# Patient Record
Sex: Male | Born: 1960
Health system: Southern US, Community
[De-identification: ages and names within clinical notes are randomized; demographics above are authoritative.]

## PROBLEM LIST (undated history)

## (undated) DIAGNOSIS — F329 Major depressive disorder, single episode, unspecified: Secondary | ICD-10-CM

## (undated) DIAGNOSIS — M549 Dorsalgia, unspecified: Secondary | ICD-10-CM

## (undated) DIAGNOSIS — Z8719 Personal history of other diseases of the digestive system: Secondary | ICD-10-CM

## (undated) DIAGNOSIS — M25569 Pain in unspecified knee: Secondary | ICD-10-CM

## (undated) DIAGNOSIS — N289 Disorder of kidney and ureter, unspecified: Secondary | ICD-10-CM

## (undated) DIAGNOSIS — Z8711 Personal history of peptic ulcer disease: Secondary | ICD-10-CM

## (undated) DIAGNOSIS — F32A Depression, unspecified: Secondary | ICD-10-CM

## (undated) DIAGNOSIS — G8929 Other chronic pain: Secondary | ICD-10-CM

## (undated) DIAGNOSIS — I509 Heart failure, unspecified: Secondary | ICD-10-CM

## (undated) DIAGNOSIS — M542 Cervicalgia: Secondary | ICD-10-CM

## (undated) DIAGNOSIS — M5416 Radiculopathy, lumbar region: Secondary | ICD-10-CM

## (undated) HISTORY — PX: KNEE SURGERY: SHX244

## (undated) HISTORY — PX: APPENDECTOMY: SHX54

## (undated) HISTORY — PX: JOINT REPLACEMENT: SHX530

---

## 2001-05-29 ENCOUNTER — Ambulatory Visit (HOSPITAL_COMMUNITY): Admission: RE | Admit: 2001-05-29 | Discharge: 2001-05-29 | Payer: Self-pay | Admitting: Family Medicine

## 2001-05-29 ENCOUNTER — Encounter: Payer: Self-pay | Admitting: Family Medicine

## 2001-11-30 ENCOUNTER — Emergency Department (HOSPITAL_COMMUNITY): Admission: EM | Admit: 2001-11-30 | Discharge: 2001-11-30 | Payer: Self-pay | Admitting: Emergency Medicine

## 2001-12-29 ENCOUNTER — Ambulatory Visit (HOSPITAL_COMMUNITY): Admission: RE | Admit: 2001-12-29 | Discharge: 2001-12-29 | Payer: Self-pay | Admitting: Neurosurgery

## 2002-01-06 ENCOUNTER — Ambulatory Visit (HOSPITAL_COMMUNITY): Admission: RE | Admit: 2002-01-06 | Discharge: 2002-01-06 | Payer: Self-pay | Admitting: Neurosurgery

## 2002-01-06 ENCOUNTER — Encounter: Payer: Self-pay | Admitting: Neurosurgery

## 2002-08-19 ENCOUNTER — Emergency Department (HOSPITAL_COMMUNITY): Admission: EM | Admit: 2002-08-19 | Discharge: 2002-08-19 | Payer: Self-pay | Admitting: Internal Medicine

## 2003-07-01 ENCOUNTER — Emergency Department (HOSPITAL_COMMUNITY): Admission: EM | Admit: 2003-07-01 | Discharge: 2003-07-01 | Payer: Self-pay | Admitting: Emergency Medicine

## 2003-07-22 ENCOUNTER — Emergency Department (HOSPITAL_COMMUNITY): Admission: EM | Admit: 2003-07-22 | Discharge: 2003-07-22 | Payer: Self-pay | Admitting: Emergency Medicine

## 2004-05-04 ENCOUNTER — Emergency Department (HOSPITAL_COMMUNITY): Admission: EM | Admit: 2004-05-04 | Discharge: 2004-05-04 | Payer: Self-pay | Admitting: Emergency Medicine

## 2004-05-07 ENCOUNTER — Emergency Department (HOSPITAL_COMMUNITY): Admission: EM | Admit: 2004-05-07 | Discharge: 2004-05-07 | Payer: Self-pay | Admitting: Emergency Medicine

## 2004-05-14 ENCOUNTER — Ambulatory Visit (HOSPITAL_COMMUNITY): Admission: RE | Admit: 2004-05-14 | Discharge: 2004-05-14 | Payer: Self-pay | Admitting: Orthopedic Surgery

## 2004-10-17 ENCOUNTER — Emergency Department (HOSPITAL_COMMUNITY): Admission: EM | Admit: 2004-10-17 | Discharge: 2004-10-18 | Payer: Self-pay | Admitting: *Deleted

## 2005-02-15 ENCOUNTER — Emergency Department (HOSPITAL_COMMUNITY): Admission: EM | Admit: 2005-02-15 | Discharge: 2005-02-15 | Payer: Self-pay | Admitting: Emergency Medicine

## 2008-08-05 ENCOUNTER — Ambulatory Visit (HOSPITAL_COMMUNITY): Admission: RE | Admit: 2008-08-05 | Discharge: 2008-08-05 | Payer: Self-pay | Admitting: Family Medicine

## 2008-08-08 ENCOUNTER — Ambulatory Visit (HOSPITAL_COMMUNITY): Admission: RE | Admit: 2008-08-08 | Discharge: 2008-08-08 | Payer: Self-pay | Admitting: Family Medicine

## 2009-04-12 ENCOUNTER — Ambulatory Visit (HOSPITAL_COMMUNITY): Admission: RE | Admit: 2009-04-12 | Discharge: 2009-04-12 | Payer: Self-pay | Admitting: Family Medicine

## 2010-11-18 ENCOUNTER — Encounter: Payer: Self-pay | Admitting: *Deleted

## 2012-07-02 ENCOUNTER — Encounter (HOSPITAL_COMMUNITY): Payer: Self-pay | Admitting: *Deleted

## 2012-07-02 ENCOUNTER — Emergency Department (HOSPITAL_COMMUNITY)
Admission: EM | Admit: 2012-07-02 | Discharge: 2012-07-02 | Disposition: A | Discharge status: Home / Self Care | Payer: Self-pay | Attending: Emergency Medicine | Admitting: Emergency Medicine

## 2012-07-02 DIAGNOSIS — M549 Dorsalgia, unspecified: Secondary | ICD-10-CM | POA: Insufficient documentation

## 2012-07-02 DIAGNOSIS — F329 Major depressive disorder, single episode, unspecified: Secondary | ICD-10-CM | POA: Insufficient documentation

## 2012-07-02 DIAGNOSIS — G8929 Other chronic pain: Secondary | ICD-10-CM | POA: Insufficient documentation

## 2012-07-02 DIAGNOSIS — F3289 Other specified depressive episodes: Secondary | ICD-10-CM | POA: Insufficient documentation

## 2012-07-02 HISTORY — DX: Personal history of peptic ulcer disease: Z87.11

## 2012-07-02 HISTORY — DX: Major depressive disorder, single episode, unspecified: F32.9

## 2012-07-02 HISTORY — DX: Depression, unspecified: F32.A

## 2012-07-02 HISTORY — DX: Personal history of other diseases of the digestive system: Z87.19

## 2012-07-02 MED ORDER — OXYCODONE-ACETAMINOPHEN 5-325 MG PO TABS: 1.0000 | ORAL_TABLET | ORAL | Status: AC | PRN

## 2012-07-02 MED ORDER — OXYCODONE-ACETAMINOPHEN 5-325 MG PO TABS
1.0000 | ORAL_TABLET | Freq: Once | ORAL | Status: AC
  Administered 2012-07-02: 1 via ORAL
  Filled 2012-07-02: qty 1

## 2012-07-02 NOTE — ED Provider Notes (Signed)
History     CSN: [id number]  Arrival date & time 07/02/12  1415   First MD Initiated Contact with Patient 07/02/12 1445      Chief Complaint  Patient presents with  . Back Pain    (Consider location/radiation/quality/duration/timing/severity/associated sxs/prior treatment) HPI Comments: PARVIN STETZER  presents with acute on chronic low back pain which has which has been present for the past 2 days.   Patient denies any new injury specifically.  There is  radiation of pain into his right lower extremity which is constant for him and not worsened.  There has been no weakness or numbness in the lower extremities and no urinary or bowel retention or incontinence.  Patient does not have a history of cancer or IVDU.  He has seen Dr. Jeral Fruit in the past and has known degenerative disk disease in his lower back per MRI but it was determined he would not benefit from surgery.  He has been on narcotic prescriptions for pain control,  Ran out approximately 1 month ago.    The history is provided by the patient.    Past Medical History  Diagnosis Date  . Back pain   . History of stomach ulcers   . Depression     Past Surgical History  Procedure Date  . Appendectomy     No family history on file.  History  Substance Use Topics  . Smoking status: Never Smoker   . Smokeless tobacco: Not on file  . Alcohol Use: No      Review of Systems  Constitutional: Negative for fever.  Respiratory: Negative for shortness of breath.   Cardiovascular: Negative for chest pain and leg swelling.  Gastrointestinal: Negative for abdominal pain, constipation and abdominal distention.  Genitourinary: Negative for dysuria, urgency, frequency, flank pain and difficulty urinating.  Musculoskeletal: Positive for back pain. Negative for joint swelling and gait problem.  Skin: Negative for rash.  Neurological: Negative for weakness and numbness.    Allergies  Review of patient's allergies indicates no  known allergies.  Home Medications   Current Outpatient Rx  Name Route Sig Dispense Refill  . BC HEADACHE POWDER PO Oral Take 1 packet by mouth 2 (two) times daily as needed. Arthritis in back and knees.    Marland Kitchen VITAMIN B-12 CR PO Oral Take 1 tablet by mouth daily.    Marland Kitchen DOXEPIN HCL 100 MG PO CAPS Oral Take 100 mg by mouth at bedtime.    . OXYCODONE-ACETAMINOPHEN 5-325 MG PO TABS Oral Take 1 tablet by mouth every 4 (four) hours as needed for pain. 20 tablet 0    BP 137/81  Pulse 73  Temp 97.9 F (36.6 C) (Oral)  Resp 20  Ht 5\' 11"  (1.803 m)  Wt 155 lb (70.308 kg)  BMI 21.62 kg/m2  SpO2 98%  Physical Exam  Nursing note and vitals reviewed. Constitutional: He appears well-developed and well-nourished.  HENT:  Head: Normocephalic.  Eyes: Conjunctivae are normal.  Neck: Normal range of motion. Neck supple.  Cardiovascular: Normal rate and intact distal pulses.        Pedal pulses normal.  Pulmonary/Chest: Effort normal.  Abdominal: Soft. Bowel sounds are normal. He exhibits no distension and no mass.  Musculoskeletal: Normal range of motion. He exhibits no edema.       Lumbar back: He exhibits tenderness. He exhibits no swelling, no edema and no spasm.  Neurological: He is alert. He has normal strength. He displays no atrophy and no tremor. No  sensory deficit. Gait normal.  Reflex Scores:      Patellar reflexes are 2+ on the right side and 2+ on the left side.      Achilles reflexes are 2+ on the right side and 2+ on the left side.      No strength deficit noted in hip and knee flexor and extensor muscle groups.  Ankle flexion and extension intact.  Skin: Skin is warm and dry.  Psychiatric: He has a normal mood and affect.    ED Course  Procedures (including critical care time)  Labs Reviewed - No data to display No results found.   1. Chronic back pain       MDM  Oxycodone prescribed.  Pt given referrals for pcp and for chronic pain management.  No neuro deficit on  exam or by history to suggest emergent or surgical presentation.  Also discussed worsened sx that should prompt immediate re-evaluation including distal weakness, bowel/bladder retention/incontinence.              Burgess Amor, [place] 07/02/12 [phone]

## 2012-07-02 NOTE — ED Notes (Signed)
States he has a long history of back pain and does not have any meds for pain at this time.

## 2012-07-02 NOTE — ED Provider Notes (Signed)
Medical screening examination/treatment/procedure(s) were performed by non-physician practitioner and as supervising physician I was immediately available for consultation/collaboration.   Benny Lennert, MD 07/02/12 [phone]

## 2012-07-02 NOTE — Discharge Instructions (Signed)
Chronic Back Pain When back pain lasts longer than 3 months, it is called chronic back pain.This pain can be frustrating, but the cause of the pain is rarely dangerous.People with chronic back pain often go through certain periods that are more intense (flare-ups). CAUSES Chronic back pain can be caused by wear and tear (degeneration) on different structures in your back. These structures may include bones, ligaments, or discs. This degeneration may result in more pressure being placed on the nerves that travel to your legs and feet. This can lead to pain traveling from the low back down the back of the legs. When pain lasts longer than 3 months, it is not unusual for people to experience anxiety or depression. Anxiety and depression can also contribute to low back pain. TREATMENT  Establish a regular exercise plan. This is critical to improving your functional level.   Have a self-management plan for when you flare-up. Flare-ups rarely require a medical visit. Regular exercise will help reduce the intensity and frequency of your flare-ups.   Manage how you feel about your back pain and the rest of your life. Anxiety, depression, and feeling that you cannot alter your back pain have been shown to make back pain more intense and debilitating.   Medicines should never be your only treatment. They should be used along with other treatments to help you return to a more active lifestyle.   Procedures such as injections or surgery may be helpful but are rarely necessary. You may be able to get the same results with physical therapy or chiropractic care.  HOME CARE INSTRUCTIONS  Avoid bending, heavy lifting, prolonged sitting, and activities which make the problem worse.   Continue normal activity as much as possible.   Take brief periods of rest throughout the day to reduce your pain during flare-ups.   Follow your back exercise rehabilitation program. This can help reduce symptoms and prevent  more pain.   Only take over-the-counter or prescription medicines as directed by your caregiver. Muscle relaxants are sometimes prescribed. Narcotic pain medicine is discouraged for long-term pain, since addiction is a possible outcome.   If you smoke, quit.   Eat healthy foods and maintain a recommended body weight.  SEEK IMMEDIATE MEDICAL CARE IF:   You have weakness or numbness in one of your legs or feet.   You have trouble controlling your bladder or bowels.   You develop nausea, vomiting, abdominal pain, shortness of breath, or fainting.  Document Released: 11/21/2004 Document Revised: 10/03/2011 Document Reviewed: 09/28/2011 [employer] Patient Information 2012 [employer]. 

## 2012-07-02 NOTE — ED Notes (Signed)
Lower back pain x 2 days  Denies new injury

## 2012-07-26 ENCOUNTER — Encounter (HOSPITAL_COMMUNITY): Payer: Self-pay | Admitting: Emergency Medicine

## 2012-07-26 ENCOUNTER — Emergency Department (HOSPITAL_COMMUNITY)
Admission: EM | Admit: 2012-07-26 | Discharge: 2012-07-26 | Disposition: A | Discharge status: Home / Self Care | Payer: Self-pay | Attending: Emergency Medicine | Admitting: Emergency Medicine

## 2012-07-26 DIAGNOSIS — Z9089 Acquired absence of other organs: Secondary | ICD-10-CM | POA: Insufficient documentation

## 2012-07-26 DIAGNOSIS — F3289 Other specified depressive episodes: Secondary | ICD-10-CM | POA: Insufficient documentation

## 2012-07-26 DIAGNOSIS — M25569 Pain in unspecified knee: Secondary | ICD-10-CM | POA: Insufficient documentation

## 2012-07-26 DIAGNOSIS — F329 Major depressive disorder, single episode, unspecified: Secondary | ICD-10-CM | POA: Insufficient documentation

## 2012-07-26 DIAGNOSIS — M545 Low back pain, unspecified: Secondary | ICD-10-CM | POA: Insufficient documentation

## 2012-07-26 DIAGNOSIS — G8929 Other chronic pain: Secondary | ICD-10-CM | POA: Insufficient documentation

## 2012-07-26 DIAGNOSIS — Z8711 Personal history of peptic ulcer disease: Secondary | ICD-10-CM | POA: Insufficient documentation

## 2012-07-26 MED ORDER — OXYCODONE-ACETAMINOPHEN 5-325 MG PO TABS
1.0000 | ORAL_TABLET | Freq: Once | ORAL | Status: AC
  Administered 2012-07-26: 1 via ORAL
  Filled 2012-07-26: qty 1

## 2012-07-26 MED ORDER — OXYCODONE-ACETAMINOPHEN 5-325 MG PO TABS: 1.0000 | ORAL_TABLET | ORAL | Status: AC | PRN

## 2012-07-26 NOTE — ED Notes (Signed)
Patient with no complaints at this time. Respirations even and unlabored. Skin warm/dry. Discharge instructions reviewed with patient at this time. Patient given opportunity to voice concerns/ask questions. Patient discharged at this time and left Emergency Department with steady gait.   

## 2012-07-26 NOTE — ED Provider Notes (Signed)
History     CSN: [id number]  Arrival date & time 07/26/12  1401   First MD Initiated Contact with Patient 07/26/12 1457      Chief Complaint  Patient presents with  . Knee Pain    (Consider location/radiation/quality/duration/timing/severity/associated sxs/prior treatment) Patient is a 51 y.o. male presenting with knee pain. The history is provided by the patient.  Knee Pain This is a chronic problem. The current episode started 1 to 4 weeks ago. The problem occurs constantly. The problem has been unchanged. Associated symptoms include arthralgias. Pertinent negatives include no chills, fever, headaches, joint swelling, myalgias, nausea, neck pain, numbness, sore throat, vomiting or weakness. The symptoms are aggravated by bending, standing, walking and twisting. He has tried NSAIDs for the symptoms. The treatment provided no relief.    Past Medical History  Diagnosis Date  . Back pain   . History of stomach ulcers   . Depression     Past Surgical History  Procedure Date  . Appendectomy     No family history on file.  History  Substance Use Topics  . Smoking status: Never Smoker   . Smokeless tobacco: Not on file  . Alcohol Use: No      Review of Systems  Constitutional: Negative for fever and chills.  HENT: Negative for sore throat and neck pain.   Gastrointestinal: Negative for nausea and vomiting.  Genitourinary: Negative for dysuria and difficulty urinating.  Musculoskeletal: Positive for arthralgias. Negative for myalgias, back pain and joint swelling.  Skin: Negative for color change and wound.  Neurological: Negative for weakness, numbness and headaches.  All other systems reviewed and are negative.    Allergies  Review of patient's allergies indicates no known allergies.  Home Medications   Current Outpatient Rx  Name Route Sig Dispense Refill  . BC HEADACHE POWDER PO Oral Take 1 packet by mouth 2 (two) times daily as needed. Arthritis in back and  knees.    Marland Kitchen VITAMIN B-12 CR PO Oral Take 1 tablet by mouth daily.    Marland Kitchen DOXEPIN HCL 100 MG PO CAPS Oral Take 100 mg by mouth at bedtime.      BP 134/90  Pulse 99  Temp 97.9 F (36.6 C) (Oral)  Resp 20  Ht 5\' 11"  (1.803 m)  Wt 160 lb (72.576 kg)  BMI 22.32 kg/m2  SpO2 99%  Physical Exam  Nursing note and vitals reviewed. Constitutional: He is oriented to person, place, and time. He appears well-developed and well-nourished. No distress.  HENT:  Head: Normocephalic and atraumatic.  Cardiovascular: Normal rate, regular rhythm, normal heart sounds and intact distal pulses.   Pulmonary/Chest: Effort normal and breath sounds normal.  Musculoskeletal: He exhibits tenderness. He exhibits no edema.       Right knee: He exhibits normal range of motion, no swelling, no effusion, no ecchymosis, no deformity and no laceration. tenderness found. No patellar tendon tenderness noted.       Legs:      Diffuse ttp of the right knee.  No erythema, bruising, effusion or deformity.  Patient has full ROM of the knee, pain reproduced with full extension.  distal sensation intact, DP pulse intact  Neurological: He is alert and oriented to person, place, and time. He exhibits normal muscle tone. Coordination normal.  Skin: Skin is warm and dry. No erythema.    ED Course  Procedures (including critical care time)  Labs Reviewed - No data to display No results found.  MDM   Previous ED chart reviewed.   Chronic low back and knee pain.  No new injury.  Doubt infectious process.  Pt agrees to f/u with ortho.  Ambulates with a steady gait.    The patient appears reasonably screened and/or stabilized for discharge and I doubt any other medical condition or other [facility] requiring further screening, evaluation, or treatment in the ED at this time prior to discharge.   Prescribed: Percocet #20      Carianna Lague L. [place], [place] 07/27/12 2126

## 2012-07-26 NOTE — ED Notes (Signed)
Pt c/o right knee and lower back pain. H/s same.

## 2012-07-30 NOTE — ED Provider Notes (Signed)
Medical screening examination/treatment/procedure(s) were performed by non-physician practitioner and as supervising physician I was immediately available for consultation/collaboration.  Joshva Labreck, MD 07/30/12 1051 

## 2012-07-31 ENCOUNTER — Emergency Department (HOSPITAL_COMMUNITY): Payer: Self-pay

## 2012-07-31 ENCOUNTER — Encounter (HOSPITAL_COMMUNITY): Payer: Self-pay | Admitting: *Deleted

## 2012-07-31 ENCOUNTER — Emergency Department (HOSPITAL_COMMUNITY)
Admission: EM | Admit: 2012-07-31 | Discharge: 2012-07-31 | Disposition: A | Discharge status: Home / Self Care | Payer: Self-pay | Attending: Emergency Medicine | Admitting: Emergency Medicine

## 2012-07-31 DIAGNOSIS — M25569 Pain in unspecified knee: Secondary | ICD-10-CM | POA: Insufficient documentation

## 2012-07-31 DIAGNOSIS — Z79899 Other long term (current) drug therapy: Secondary | ICD-10-CM | POA: Insufficient documentation

## 2012-07-31 DIAGNOSIS — M25561 Pain in right knee: Secondary | ICD-10-CM

## 2012-07-31 MED ORDER — OXYCODONE-ACETAMINOPHEN 5-325 MG PO TABS
1.0000 | ORAL_TABLET | Freq: Once | ORAL | Status: AC
  Administered 2012-07-31: 1 via ORAL
  Filled 2012-07-31: qty 1

## 2012-07-31 MED ORDER — OXYCODONE-ACETAMINOPHEN 5-325 MG PO TABS: ORAL_TABLET | ORAL | Status: DC

## 2012-07-31 NOTE — ED Notes (Signed)
Pt with right knee pain, took last dose of pain med earlier today, pt working on getting seen by Dr. Hilda Lias but pt states that he has no insurance at this time

## 2012-07-31 NOTE — ED Notes (Signed)
Pain and "a knot on rt knee,"  Has a KI on rt leg

## 2012-07-31 NOTE — ED Provider Notes (Signed)
History     CSN: [id number]  Arrival date & time 07/31/12  2151   First MD Initiated Contact with Patient 07/31/12 2230      Chief Complaint  Patient presents with  . Knee Pain    (Consider location/radiation/quality/duration/timing/severity/associated sxs/prior treatment) HPI Comments: R knee pain for about 1 month.  No recent trauma.  Here on 07-26-12 for same.  Is speaking with a person here at [facility] to see if he can get assistance with paying for him to see the orthopedist.   Patient is a 51 y.o. male presenting with knee pain. The history is provided by the patient. No language interpreter was used.  Knee Pain This is a new problem. Episode onset: 1 month ago. The problem has been unchanged. Pertinent negatives include no chills, fever, numbness or weakness. The symptoms are aggravated by standing and walking.    Past Medical History  Diagnosis Date  . Back pain   . History of stomach ulcers   . Depression     Past Surgical History  Procedure Date  . Appendectomy   . Knee surgery     History reviewed. No pertinent family history.  History  Substance Use Topics  . Smoking status: Never Smoker   . Smokeless tobacco: Not on file  . Alcohol Use: No      Review of Systems  Constitutional: Negative for fever and chills.  Neurological: Negative for weakness and numbness.  All other systems reviewed and are negative.    Allergies  Review of patient's allergies indicates no known allergies.  Home Medications   Current Outpatient Rx  Name Route Sig Dispense Refill  . BC HEADACHE POWDER PO Oral Take 1 packet by mouth 2 (two) times daily as needed. Arthritis in back and knees.    Marland Kitchen VITAMIN B-12 CR PO Oral Take 1 tablet by mouth daily.    Marland Kitchen DOXEPIN HCL 100 MG PO CAPS Oral Take 100 mg by mouth at bedtime.    . OXYCODONE-ACETAMINOPHEN 5-325 MG PO TABS Oral Take 1 tablet by mouth every 4 (four) hours as needed for pain. 20 tablet 0  . OXYCODONE-ACETAMINOPHEN 5-325  MG PO TABS  One tab po q 6 hrs prn pain 12 tablet 0    BP 119/82  Pulse 88  Temp 98.2 F (36.8 C) (Oral)  Resp 20  Ht 5\' 11"  (1.803 m)  Wt 165 lb (74.844 kg)  BMI 23.01 kg/m2  SpO2 100%  Physical Exam  Nursing note and vitals reviewed. Constitutional: He is oriented to person, place, and time. He appears well-developed and well-nourished.  HENT:  Head: Normocephalic and atraumatic.  Eyes: EOM are normal.  Neck: Normal range of motion.  Cardiovascular: Normal rate, regular rhythm, normal heart sounds and intact distal pulses.   Pulmonary/Chest: Effort normal and breath sounds normal. No respiratory distress.  Abdominal: Soft. He exhibits no distension. There is no tenderness.  Musculoskeletal:       Right knee: He exhibits decreased range of motion. He exhibits no swelling, no effusion, no ecchymosis, no deformity, no laceration, no erythema and normal alignment. tenderness found. Lateral joint line tenderness noted.       Legs: Neurological: He is alert and oriented to person, place, and time.  Skin: Skin is warm and dry.  Psychiatric: He has a normal mood and affect. Judgment normal.    ED Course  Procedures (including critical care time)  Labs Reviewed - No data to display Dg Knee Complete 4 Views Right  07/31/2012  *RADIOLOGY REPORT*  Clinical Data: Right knee pain.  RIGHT KNEE - COMPLETE 4+ VIEW  Comparison: None  Findings: No acute bony abnormality.  Specifically, no fracture, subluxation, or dislocation.  Soft tissues are intact.  No joint effusion. Joint spaces are maintained.  Normal bone mineralization.  IMPRESSION: No bony abnormality.   Original Report Authenticated By: Cyndie Chime, M.D.      1. Right knee pain       MDM          Evalina Field, PA 08/01/12 1837

## 2012-08-01 NOTE — ED Provider Notes (Signed)
Medical screening examination/treatment/procedure(s) were performed by non-physician practitioner and as supervising physician I was immediately available for consultation/collaboration.  Nataki Mccrumb, MD 08/01/12 2329 

## 2012-09-07 ENCOUNTER — Emergency Department (HOSPITAL_COMMUNITY)
Admission: EM | Admit: 2012-09-07 | Discharge: 2012-09-07 | Disposition: A | Discharge status: Home / Self Care | Payer: Self-pay | Attending: Emergency Medicine | Admitting: Emergency Medicine

## 2012-09-07 ENCOUNTER — Encounter (HOSPITAL_COMMUNITY): Payer: Self-pay

## 2012-09-07 DIAGNOSIS — G8929 Other chronic pain: Secondary | ICD-10-CM | POA: Insufficient documentation

## 2012-09-07 DIAGNOSIS — F3289 Other specified depressive episodes: Secondary | ICD-10-CM | POA: Insufficient documentation

## 2012-09-07 DIAGNOSIS — R109 Unspecified abdominal pain: Secondary | ICD-10-CM | POA: Insufficient documentation

## 2012-09-07 DIAGNOSIS — M545 Low back pain, unspecified: Secondary | ICD-10-CM | POA: Insufficient documentation

## 2012-09-07 DIAGNOSIS — Z8711 Personal history of peptic ulcer disease: Secondary | ICD-10-CM | POA: Insufficient documentation

## 2012-09-07 DIAGNOSIS — M2550 Pain in unspecified joint: Secondary | ICD-10-CM | POA: Insufficient documentation

## 2012-09-07 DIAGNOSIS — F329 Major depressive disorder, single episode, unspecified: Secondary | ICD-10-CM | POA: Insufficient documentation

## 2012-09-07 MED ORDER — OXYCODONE-ACETAMINOPHEN 5-325 MG PO TABS: 1.0000 | ORAL_TABLET | ORAL | Status: DC | PRN

## 2012-09-07 NOTE — ED Provider Notes (Signed)
History     CSN: [id number]  Arrival date & time 09/07/12  1656   First MD Initiated Contact with Patient 09/07/12 1850      Chief Complaint  Patient presents with  . Back Pain    (Consider location/radiation/quality/duration/timing/severity/associated sxs/prior treatment) Patient is a 51 y.o. male presenting with back pain. The history is provided by the patient.  Back Pain  This is a chronic problem. The problem occurs daily. The problem has been gradually worsening. The pain is associated with no known injury. The pain is present in the lumbar spine. The quality of the pain is described as aching. The pain is severe. The symptoms are aggravated by certain positions. The pain is the same all the time. Stiffness is present all day. Associated symptoms include abdominal pain. Pertinent negatives include no chest pain, no fever, no bowel incontinence, no perianal numbness, no bladder incontinence and no dysuria. He has tried analgesics for the symptoms. The treatment provided mild relief.    Past Medical History  Diagnosis Date  . Back pain   . History of stomach ulcers   . Depression     Past Surgical History  Procedure Date  . Appendectomy   . Knee surgery     No family history on file.  History  Substance Use Topics  . Smoking status: Never Smoker   . Smokeless tobacco: Not on file  . Alcohol Use: No      Review of Systems  Constitutional: Negative for fever and activity change.       All ROS Neg except as noted in HPI  HENT: Negative for nosebleeds and neck pain.   Eyes: Negative for photophobia and discharge.  Respiratory: Negative for cough, shortness of breath and wheezing.   Cardiovascular: Negative for chest pain and palpitations.  Gastrointestinal: Positive for abdominal pain. Negative for blood in stool and bowel incontinence.  Genitourinary: Negative for bladder incontinence, dysuria, frequency and hematuria.  Musculoskeletal: Positive for back pain  and arthralgias.  Skin: Negative.   Neurological: Negative for dizziness, seizures and speech difficulty.  Psychiatric/Behavioral: Negative for hallucinations and confusion.    Allergies  Review of patient's allergies indicates no known allergies.  Home Medications   Current Outpatient Rx  Name  Route  Sig  Dispense  Refill  . BC HEADACHE POWDER PO   Oral   Take 1 packet by mouth 2 (two) times daily as needed. Arthritis in back and knees.         Marland Kitchen VITAMIN B-12 CR PO   Oral   Take 1 tablet by mouth daily.         Marland Kitchen DOXEPIN HCL 100 MG PO CAPS   Oral   Take 100 mg by mouth at bedtime.         . OXYCODONE-ACETAMINOPHEN 5-325 MG PO TABS      One tab po q 6 hrs prn pain   12 tablet   0     BP 112/79  Pulse 92  Temp 98.6 F (37 C) (Oral)  Resp 20  SpO2 100%  Physical Exam  Nursing note and vitals reviewed. Constitutional: He is oriented to person, place, and time. He appears well-developed and well-nourished.  Non-toxic appearance.  HENT:  Head: Normocephalic.  Right Ear: Tympanic membrane and external ear normal.  Left Ear: Tympanic membrane and external ear normal.  Eyes: EOM and lids are normal. Pupils are equal, round, and reactive to light.  Neck: Normal range of motion. Neck supple.  Carotid bruit is not present.  Cardiovascular: Normal rate, regular rhythm, normal heart sounds, intact distal pulses and normal pulses.   Pulmonary/Chest: Breath sounds normal. No respiratory distress.  Abdominal: Soft. Bowel sounds are normal. There is no tenderness. There is no guarding.  Musculoskeletal:       There is right and left paraspinal tenderness to palpation of the lumbar region. There no hot areas noted. There is no palpable deformity of the lower back.  Lymphadenopathy:       Head (right side): No submandibular adenopathy present.       Head (left side): No submandibular adenopathy present.    He has no cervical adenopathy.  Neurological: He is alert and  oriented to person, place, and time. He has normal strength. No cranial nerve deficit or sensory deficit.       Patient walks in short steps but the gait is steady. Sensory is symmetrical. No noted foot drop.  Skin: Skin is warm and dry.  Psychiatric: He has a normal mood and affect. His speech is normal.    ED Course  Procedures (including critical care time)  Labs Reviewed - No data to display No results found.   No diagnosis found.    MDM  I have reviewed nursing notes, vital signs, and all appropriate lab and imaging results for this patient. Patient has history of chronic back pain at multiple sites. He has been diagnosed with disc disease problems. Patient also has chronic right knee related pain problem. The patient does not have insurance and cannot see an orthopedic at this time. He states that he has not slept in 2 nights and requests assistance with his pain. Prescription for Percocet one every 6 hours as needed for pain #15 given to the patient.       Kathie Dike, [place] 09/07/12 [phone]

## 2012-09-07 NOTE — ED Notes (Signed)
Pt reports chronic back pain, pain became severe 2 days ago, out of meds for pain. Is usually on oxycodone, denies any recent injury

## 2012-09-08 NOTE — ED Provider Notes (Signed)
Medical screening examination/treatment/procedure(s) were performed by non-physician practitioner and as supervising physician I was immediately available for consultation/collaboration.   Courtni Balash, MD 09/08/12 0007 

## 2012-09-21 ENCOUNTER — Emergency Department (HOSPITAL_COMMUNITY)
Admission: EM | Admit: 2012-09-21 | Discharge: 2012-09-21 | Disposition: A | Discharge status: Home / Self Care | Payer: Self-pay | Attending: Emergency Medicine | Admitting: Emergency Medicine

## 2012-09-21 ENCOUNTER — Encounter (HOSPITAL_COMMUNITY): Payer: Self-pay | Admitting: Emergency Medicine

## 2012-09-21 DIAGNOSIS — M549 Dorsalgia, unspecified: Secondary | ICD-10-CM | POA: Insufficient documentation

## 2012-09-21 DIAGNOSIS — G8929 Other chronic pain: Secondary | ICD-10-CM | POA: Insufficient documentation

## 2012-09-21 DIAGNOSIS — M25569 Pain in unspecified knee: Secondary | ICD-10-CM | POA: Insufficient documentation

## 2012-09-21 DIAGNOSIS — Z8659 Personal history of other mental and behavioral disorders: Secondary | ICD-10-CM | POA: Insufficient documentation

## 2012-09-21 DIAGNOSIS — Z8719 Personal history of other diseases of the digestive system: Secondary | ICD-10-CM | POA: Insufficient documentation

## 2012-09-21 DIAGNOSIS — R1013 Epigastric pain: Secondary | ICD-10-CM | POA: Insufficient documentation

## 2012-09-21 LAB — CBC WITH DIFFERENTIAL/PLATELET
Basophils Relative: 0 % (ref 0–1)
Eosinophils Absolute: 0.1 10*3/uL (ref 0.0–0.7)
Eosinophils Relative: 1 % (ref 0–5)
Lymphocytes Relative: 22 % (ref 12–46)
Lymphs Abs: 1.7 10*3/uL (ref 0.7–4.0)
MCH: 31.7 pg (ref 26.0–34.0)
MCHC: 34 g/dL (ref 30.0–36.0)
MCV: 93.1 fL (ref 78.0–100.0)
Neutro Abs: 5.8 10*3/uL (ref 1.7–7.7)
Neutrophils Relative %: 73 % (ref 43–77)
WBC: 7.9 10*3/uL (ref 4.0–10.5)

## 2012-09-21 LAB — TYPE AND SCREEN: Antibody Screen: NEGATIVE

## 2012-09-21 LAB — COMPREHENSIVE METABOLIC PANEL
AST: 18 U/L (ref 0–37)
Albumin: 4.1 g/dL (ref 3.5–5.2)
BUN: 13 mg/dL (ref 6–23)
CO2: 25 mEq/L (ref 19–32)
Chloride: 105 mEq/L (ref 96–112)
Total Protein: 7.5 g/dL (ref 6.0–8.3)

## 2012-09-21 MED ORDER — PANTOPRAZOLE SODIUM 40 MG PO TBEC
40.0000 mg | DELAYED_RELEASE_TABLET | Freq: Once | ORAL | Status: AC
  Administered 2012-09-21: 40 mg via ORAL
  Filled 2012-09-21: qty 1

## 2012-09-21 MED ORDER — OXYCODONE-ACETAMINOPHEN 5-325 MG PO TABS: 1.0000 | ORAL_TABLET | Freq: Four times a day (QID) | ORAL | Status: DC | PRN

## 2012-09-21 MED ORDER — GI COCKTAIL ~~LOC~~
30.0000 mL | Freq: Once | ORAL | Status: AC
  Administered 2012-09-21: 30 mL via ORAL
  Filled 2012-09-21: qty 30

## 2012-09-21 MED ORDER — OXYCODONE-ACETAMINOPHEN 5-325 MG PO TABS
1.0000 | ORAL_TABLET | Freq: Once | ORAL | Status: AC
  Administered 2012-09-21: 1 via ORAL
  Filled 2012-09-21: qty 1

## 2012-09-21 NOTE — ED Provider Notes (Signed)
History  This chart was scribed for Jones Skene, MD by Shari Heritage, ED Scribe. The patient was seen in room APA11/APA11. Patient's care was started at 1044.  CSN: [id number]  Arrival date & time 09/21/12  1003   First MD Initiated Contact with Patient 09/21/12 1044      Chief Complaint  Patient presents with  . Abdominal Pain    The history is provided by the patient. No language interpreter was used.    HPI Comments: Carl Adkins is a 51 y.o. male who presents to the Emergency Department complaining of burning, moderate to severe, waxing and waning, upper abdominal pain onset 7 hours ago. He rates the pain as 7/10. There is associated diarrhea, nausea and dry heaving. Patient says that he noticed a red, bloody tint to his stool. Patient denies vomiting or black stool. Patient states that he takes 2-3 doses of Goody's Powder at a time 3 times a day for back and joint pain. Patient states that he has been seen before at [facility] for abdominal pain associated with stomach ulcers. Patient reports a medical history of arthritis in the back and bulging discs in his knees. Patient is not followed regularly by physical therapist or orthopedist. He does not drink alcohol.   No PCP  Past Medical History  Diagnosis Date  . Back pain   . History of stomach ulcers   . Depression     Past Surgical History  Procedure Date  . Appendectomy   . Knee surgery     History reviewed. No pertinent family history.  History  Substance Use Topics  . Smoking status: Never Smoker   . Smokeless tobacco: Not on file  . Alcohol Use: No     Review of Systems At least 10pt or greater review of systems completed and are negative except where specified in the HPI.  Allergies  Review of patient's allergies indicates no known allergies.  Home Medications   Current Outpatient Rx  Name  Route  Sig  Dispense  Refill  . BC HEADACHE POWDER PO   Oral   Take 1 packet by mouth 2 (two)  times daily as needed. Arthritis in back and knees.         Marland Kitchen VITAMIN B-12 CR PO   Oral   Take 1 tablet by mouth daily.         . OXYCODONE-ACETAMINOPHEN 5-325 MG PO TABS   Oral   Take 1 tablet by mouth every 6 (six) hours as needed.           Triage Vitals: BP 127/92  Pulse 80  Temp 98.9 F (37.2 C) (Oral)  Ht 5\' 11"  (1.803 m)  Wt 160 lb (72.576 kg)  BMI 22.32 kg/m2  SpO2 97%  Physical Exam Nursing notes reviewed.  Electronic medical record reviewed. VITAL SIGNS:   Filed Vitals:   09/21/12 1009  BP: 127/92  Pulse: 80  Temp: 98.9 F (37.2 C)  TempSrc: Oral  Height: 5\' 11"  (1.803 m)  Weight: 160 lb (72.576 kg)  SpO2: 97%   CONSTITUTIONAL: Awake, oriented, appears non-toxic HENT: Atraumatic, normocephalic, oral mucosa pink and moist, airway patent. Nares patent without drainage. External ears normal. EYES: Conjunctiva clear, EOMI, PERRLA NECK: Trachea midline, non-tender, supple CARDIOVASCULAR: Normal heart rate, Normal rhythm, No murmurs, rubs, gallops PULMONARY/CHEST: Clear to auscultation, no rhonchi, wheezes, or rales. Symmetrical breath sounds. Non-tender. ABDOMINAL: Non-distended, soft, non-tender - no rebound or guarding.  BS normal. Occult blood is positive. NEUROLOGIC:  Non-focal, moving all four extremities, no gross sensory or motor deficits. EXTREMITIES: No clubbing, cyanosis, or edema SKIN: Warm, Dry, No erythema, No rash RECTAL: Normal tone, enlarged prostate without nodules, no gross blood.  Positive for occult blood. ED Course  Procedures (including critical care time) DIAGNOSTIC STUDIES: Oxygen Saturation is 97% on room air, adequate by my interpretation.    COORDINATION OF CARE: 11:00 AM- Patient informed of current plan for treatment and evaluation and agrees with plan at this time. Occult blood is positive. Will administer GI cocktail, percocet and protonix.   12:28 PM- Updated patient on lab results. Advised patient that physical therapy  may be best way to treat back and knee pain. Will refer patient to sports medicine specialist.  Results for orders placed during the hospital encounter of 09/21/12  CBC WITH DIFFERENTIAL      Component Value Range   WBC 7.9  4.0 - 10.5 K/uL   RBC 5.08  4.22 - 5.81 MIL/uL   Hemoglobin 16.1  13.0 - 17.0 g/dL   HCT 40.9  81.1 - 91.4 %   MCV 93.1  78.0 - 100.0 fL   MCH 31.7  26.0 - 34.0 pg   MCHC 34.0  30.0 - 36.0 g/dL   RDW 78.2  95.6 - 21.3 %   Platelets 217  150 - 400 K/uL   Neutrophils Relative 73  43 - 77 %   Neutro Abs 5.8  1.7 - 7.7 K/uL   Lymphocytes Relative 22  12 - 46 %   Lymphs Abs 1.7  0.7 - 4.0 K/uL   Monocytes Relative 5  3 - 12 %   Monocytes Absolute 0.4  0.1 - 1.0 K/uL   Eosinophils Relative 1  0 - 5 %   Eosinophils Absolute 0.1  0.0 - 0.7 K/uL   Basophils Relative 0  0 - 1 %   Basophils Absolute 0.0  0.0 - 0.1 K/uL  COMPREHENSIVE METABOLIC PANEL      Component Value Range   Sodium 139  135 - 145 mEq/L   Potassium 4.2  3.5 - 5.1 mEq/L   Chloride 105  96 - 112 mEq/L   CO2 25  19 - 32 mEq/L   Glucose, Bld 90  70 - 99 mg/dL   BUN 13  6 - 23 mg/dL   Creatinine, Ser 0.86  0.50 - 1.35 mg/dL   Calcium 57.8  8.4 - 46.9 mg/dL   Total Protein 7.5  6.0 - 8.3 g/dL   Albumin 4.1  3.5 - 5.2 g/dL   AST 18  0 - 37 U/L   ALT 22  0 - 53 U/L   Alkaline Phosphatase 73  39 - 117 U/L   Total Bilirubin 0.3  0.3 - 1.2 mg/dL   GFR calc non Af Amer 84 (*) >90 mL/min   GFR calc Af Amer >90  >90 mL/min  PROTIME-INR      Component Value Range   Prothrombin Time 13.4  11.6 - 15.2 seconds   INR 1.03  0.00 - 1.49  TYPE AND SCREEN      Component Value Range   ABO/RH(D) O POS     Antibody Screen NEG     Sample Expiration 09/24/2012     No results found.   1. Chronic knee pain   2. Chronic back pain   3. Epigastric pain   4. History of NSAID-associated gastropathy       MDM  Carl Adkins is a 51 y.o.  male presents with NSAID induced gastropathy and likely minor GI bleed.   Very small amount of blood on OCB testing - coags normal with normal H&H.  Labs unremarkable. Pt overusing NSAIDs d/t lack of a physician and chronic arthritis pain.  Spent 10 minutes at bedside discussing pain management options - I will refer him to sports medicine for PT.  Will also Rx some norco.  I explained the diagnosis and have given explicit precautions to return to the ER including dizziness, lightheadedness, chest pain, shortness of breath or any other new or worsening symptoms. The patient understands and accepts the medical plan as it's been dictated and I have answered their questions. Discharge instructions concerning home care and prescriptions have been given.  The patient is STABLE and is discharged to home in good condition.    I personally performed the services described in this documentation, which was scribed in my presence. The recorded information has been reviewed and is accurate. Jones Skene, M.D.     Jones Skene, MD 09/21/12 2036

## 2012-09-21 NOTE — ED Notes (Signed)
Pt states has history of ulcers and has had burning and pain since about 4am. Pt denies vomiting but has had dry heaves.

## 2012-09-23 ENCOUNTER — Emergency Department (HOSPITAL_COMMUNITY): Payer: Self-pay

## 2012-09-23 ENCOUNTER — Emergency Department (HOSPITAL_COMMUNITY)
Admission: EM | Admit: 2012-09-23 | Discharge: 2012-09-23 | Disposition: A | Discharge status: Home / Self Care | Payer: Self-pay | Attending: Emergency Medicine | Admitting: Emergency Medicine

## 2012-09-23 ENCOUNTER — Encounter (HOSPITAL_COMMUNITY): Payer: Self-pay

## 2012-09-23 DIAGNOSIS — R112 Nausea with vomiting, unspecified: Secondary | ICD-10-CM | POA: Insufficient documentation

## 2012-09-23 DIAGNOSIS — Z8711 Personal history of peptic ulcer disease: Secondary | ICD-10-CM | POA: Insufficient documentation

## 2012-09-23 DIAGNOSIS — K5289 Other specified noninfective gastroenteritis and colitis: Secondary | ICD-10-CM | POA: Insufficient documentation

## 2012-09-23 DIAGNOSIS — R197 Diarrhea, unspecified: Secondary | ICD-10-CM | POA: Insufficient documentation

## 2012-09-23 DIAGNOSIS — Z8659 Personal history of other mental and behavioral disorders: Secondary | ICD-10-CM | POA: Insufficient documentation

## 2012-09-23 DIAGNOSIS — K529 Noninfective gastroenteritis and colitis, unspecified: Secondary | ICD-10-CM

## 2012-09-23 MED ORDER — LOPERAMIDE HCL 2 MG PO CAPS
4.0000 mg | ORAL_CAPSULE | Freq: Once | ORAL | Status: AC
  Administered 2012-09-23: 4 mg via ORAL
  Filled 2012-09-23: qty 2

## 2012-09-23 MED ORDER — HYDROMORPHONE HCL PF 1 MG/ML IJ SOLN
1.0000 mg | Freq: Once | INTRAMUSCULAR | Status: AC
  Administered 2012-09-23: 1 mg via INTRAVENOUS
  Filled 2012-09-23: qty 1

## 2012-09-23 MED ORDER — ONDANSETRON HCL 4 MG/2ML IJ SOLN
4.0000 mg | Freq: Once | INTRAMUSCULAR | Status: AC
  Administered 2012-09-23: 4 mg via INTRAVENOUS
  Filled 2012-09-23: qty 2

## 2012-09-23 MED ORDER — PANTOPRAZOLE SODIUM 40 MG IV SOLR
40.0000 mg | Freq: Once | INTRAVENOUS | Status: AC
  Administered 2012-09-23: 40 mg via INTRAVENOUS
  Filled 2012-09-23: qty 40

## 2012-09-23 MED ORDER — ONDANSETRON 4 MG PO TBDP: 4.0000 mg | ORAL_TABLET | Freq: Three times a day (TID) | ORAL | Status: DC | PRN

## 2012-09-23 MED ORDER — GI COCKTAIL ~~LOC~~
30.0000 mL | Freq: Once | ORAL | Status: AC
  Administered 2012-09-23: 30 mL via ORAL
  Filled 2012-09-23: qty 30

## 2012-09-23 NOTE — ED Provider Notes (Signed)
Medical screening examination/treatment/procedure(s) were performed by non-physician practitioner and as supervising physician I was immediately available for consultation/collaboration.   Dione Booze, MD 09/23/12 [phone]

## 2012-09-23 NOTE — ED Notes (Signed)
Pt states feels nauseated, dizzy and when he used the restroom he started vomiting. No emesis noted at this time. Ginger ale and salines given. EDP notified.

## 2012-09-23 NOTE — ED Notes (Signed)
MD at bedside. 

## 2012-09-23 NOTE — ED Notes (Signed)
Family at bedside. 

## 2012-09-23 NOTE — ED Provider Notes (Signed)
History     CSN: [id number]  Arrival date & time 09/23/12  9528   First MD Initiated Contact with Patient 09/23/12 [phone]      Chief Complaint  Patient presents with  . Abdominal Pain  . Emesis    (Consider location/radiation/quality/duration/timing/severity/associated sxs/prior treatment) HPI Comments: Pt states he was here 2 days ago with same sxs.  Estimates 6-8 episodes of vomiting and 4-5 episodes of diarrhea in past 2 days.  Last of each ~ 1 hr PTA in ED.  Labs from 2 days ago were normal.  States the MD that evaluated him said there was a tace of blood in his stool.  Denies hematemesis, melena or hematochezia.  No fever or chills.  Takes ASA and NSAID's chronically for back and knee pain.    Patient is a 51 y.o. male presenting with abdominal pain and vomiting. The history is provided by the patient. No language interpreter was used.  Abdominal Pain The primary symptoms of the illness include abdominal pain, nausea, vomiting and diarrhea. The primary symptoms of the illness do not include fever. The current episode started 2 days ago. The onset of the illness was gradual.  The illness is associated with NSAID use. Symptoms associated with the illness do not include chills, urgency, hematuria, frequency or back pain.  Emesis  Associated symptoms include abdominal pain and diarrhea. Pertinent negatives include no chills and no fever.    Past Medical History  Diagnosis Date  . Back pain   . History of stomach ulcers   . Depression     Past Surgical History  Procedure Date  . Appendectomy   . Knee surgery     No family history on file.  History  Substance Use Topics  . Smoking status: Never Smoker   . Smokeless tobacco: Not on file  . Alcohol Use: No      Review of Systems  Constitutional: Negative for fever and chills.  Gastrointestinal: Positive for nausea, vomiting, abdominal pain and diarrhea. Negative for anal bleeding.  Genitourinary: Negative for urgency,  frequency and hematuria.  Musculoskeletal: Negative for back pain.  All other systems reviewed and are negative.    Allergies  Review of patient's allergies indicates no known allergies.  Home Medications   Current Outpatient Rx  Name  Route  Sig  Dispense  Refill  . BC HEADACHE POWDER PO   Oral   Take 1 packet by mouth 2 (two) times daily as needed. Arthritis in back and knees.         Marland Kitchen VITAMIN B-12 CR PO   Oral   Take 1 tablet by mouth daily.         . OXYCODONE-ACETAMINOPHEN 5-325 MG PO TABS   Oral   Take 1-2 tablets by mouth every 6 (six) hours as needed for pain. Do not drive or operate machinery while taking this medicine   23 tablet   0     BP 114/73  Pulse 94  Temp 98 F (36.7 C) (Oral)  Resp 16  Ht 5\' 11"  (1.803 m)  Wt 160 lb (72.576 kg)  BMI 22.32 kg/m2  SpO2 98%  Physical Exam  Nursing note and vitals reviewed. Constitutional: He is oriented to person, place, and time. He appears well-developed and well-nourished.  HENT:  Head: Normocephalic and atraumatic.  Eyes: EOM are normal.  Neck: Normal range of motion.  Cardiovascular: Normal rate, regular rhythm and intact distal pulses.   Pulmonary/Chest: Effort normal. No respiratory distress.  Abdominal: Soft. Bowel sounds are normal. He exhibits no distension and no mass. There is no hepatosplenomegaly. There is tenderness. There is no rebound and no guarding.       Rectal exam minimal stool on exam finger.  guiac negative.abd tender only in midline between umbilicus and epigastrium.  Musculoskeletal: Normal range of motion.  Neurological: He is alert and oriented to person, place, and time.  Skin: Skin is warm and dry.  Psychiatric: He has a normal mood and affect. Judgment normal.    ED Course  Procedures (including critical care time)  Labs Reviewed - No data to display Dg Abd Acute W/chest  09/23/2012  *RADIOLOGY REPORT*  Clinical Data: Abdominal pain, nausea  ACUTE ABDOMEN SERIES (ABDOMEN  2 VIEW & CHEST 1 VIEW)  Comparison: 7/16 2010  Findings: Normal heart size and vascularity.  No focal pneumonia, collapse, consolidation, edema, effusion, or pneumothorax.  Trachea midline.  No free air.  Slight gaseous distention of small bowel in the central abdomen with scattered air fluid levels.  Air and stool throughout the colon.  Appearance is nonspecific.  No definite obstruction pattern or ileus.  No abnormal osseous finding or abnormal calcification.  IMPRESSION: No acute chest finding.  Mild gaseous distention of the central small bowel without definite obstruction or ileus.  No free air   Original Report Authenticated By: Judie Petit. Shick, M.D.      1. Gastroenteritis       MDM  abd series- no acute abnorm rx-zofran 4 mg ODT Imodium Stops ASA and NSAID's F/u with dr. Garrison [place], [place] 09/23/12 1242

## 2012-09-23 NOTE — ED Notes (Signed)
EDP at bedside, discussed plan of care. Pt verbalized understanding.

## 2012-09-23 NOTE — ED Notes (Signed)
Pt c/o abdominal pain for 2 days with nausea, vomiting, and diarrhea. Pt states he thinks he is having blood in stool. Pt vomited last about 1 hr PTA.

## 2012-09-23 NOTE — ED Notes (Signed)
Patient states that he feels dizzy and is cramping in his stomach. Vitals were okay. RN made aware.

## 2012-09-23 NOTE — ED Notes (Signed)
Patient was assisted to the bathroom. Tolerated well.

## 2012-10-28 ENCOUNTER — Emergency Department (HOSPITAL_COMMUNITY)
Admission: EM | Admit: 2012-10-28 | Discharge: 2012-10-28 | Disposition: A | Discharge status: Home / Self Care | Payer: Self-pay | Attending: Emergency Medicine | Admitting: Emergency Medicine

## 2012-10-28 ENCOUNTER — Emergency Department (HOSPITAL_COMMUNITY): Payer: Self-pay

## 2012-10-28 ENCOUNTER — Encounter (HOSPITAL_COMMUNITY): Payer: Self-pay

## 2012-10-28 DIAGNOSIS — IMO0002 Reserved for concepts with insufficient information to code with codable children: Secondary | ICD-10-CM | POA: Insufficient documentation

## 2012-10-28 DIAGNOSIS — Z8719 Personal history of other diseases of the digestive system: Secondary | ICD-10-CM | POA: Insufficient documentation

## 2012-10-28 DIAGNOSIS — S39012A Strain of muscle, fascia and tendon of lower back, initial encounter: Secondary | ICD-10-CM

## 2012-10-28 DIAGNOSIS — S335XXA Sprain of ligaments of lumbar spine, initial encounter: Secondary | ICD-10-CM | POA: Insufficient documentation

## 2012-10-28 DIAGNOSIS — Z8659 Personal history of other mental and behavioral disorders: Secondary | ICD-10-CM | POA: Insufficient documentation

## 2012-10-28 DIAGNOSIS — W1789XA Other fall from one level to another, initial encounter: Secondary | ICD-10-CM | POA: Insufficient documentation

## 2012-10-28 DIAGNOSIS — Y9339 Activity, other involving climbing, rappelling and jumping off: Secondary | ICD-10-CM | POA: Insufficient documentation

## 2012-10-28 DIAGNOSIS — Z7982 Long term (current) use of aspirin: Secondary | ICD-10-CM | POA: Insufficient documentation

## 2012-10-28 DIAGNOSIS — Y929 Unspecified place or not applicable: Secondary | ICD-10-CM | POA: Insufficient documentation

## 2012-10-28 DIAGNOSIS — S8391XA Sprain of unspecified site of right knee, initial encounter: Secondary | ICD-10-CM

## 2012-10-28 MED ORDER — OXYCODONE-ACETAMINOPHEN 5-325 MG PO TABS
1.0000 | ORAL_TABLET | Freq: Once | ORAL | Status: AC
  Administered 2012-10-28: 1 via ORAL
  Filled 2012-10-28: qty 1

## 2012-10-28 MED ORDER — OXYCODONE HCL 5 MG PO TABS: 5.0000 mg | ORAL_TABLET | Freq: Four times a day (QID) | ORAL | Status: DC | PRN

## 2012-10-28 MED ORDER — CYCLOBENZAPRINE HCL 10 MG PO TABS
10.0000 mg | ORAL_TABLET | Freq: Once | ORAL | Status: AC
  Administered 2012-10-28: 10 mg via ORAL
  Filled 2012-10-28: qty 1

## 2012-10-28 NOTE — ED Provider Notes (Signed)
History     CSN: [id number]  Arrival date & time 10/28/12  1159   First MD Initiated Contact with Patient 10/28/12 1251      Chief Complaint  Patient presents with  . Fall    (Consider location/radiation/quality/duration/timing/severity/associated sxs/prior treatment) Patient is a 52 y.o. male presenting with fall. The history is provided by the patient. No language interpreter was used.  Fall The accident occurred 3 to 5 hours ago. Incident: pt was climbing down from a deer stand and fell onto his feet.  states R knee hurting most.  has chronic back pain but it is worse than usual. He fell from a height of 6 to 10 ft. He landed on dirt. There was no blood loss. The pain is moderate. He was ambulatory at the scene. There was no entrapment after the fall. There was no drug use involved in the accident. There was no alcohol use involved in the accident. Pertinent negatives include no fever. The symptoms are aggravated by standing.    Past Medical History  Diagnosis Date  . Back pain   . History of stomach ulcers   . Depression     Past Surgical History  Procedure Date  . Appendectomy   . Knee surgery     No family history on file.  History  Substance Use Topics  . Smoking status: Never Smoker   . Smokeless tobacco: Not on file  . Alcohol Use: No      Review of Systems  Constitutional: Negative for fever.  Musculoskeletal: Positive for back pain.       Knee pain  All other systems reviewed and are negative.    Allergies  Review of patient's allergies indicates no known allergies.  Home Medications   Current Outpatient Rx  Name  Route  Sig  Dispense  Refill  . BC HEADACHE POWDER PO   Oral   Take 1 packet by mouth 2 (two) times daily as needed. Arthritis in back and knees.         Marland Kitchen VITAMIN B-12 CR PO   Oral   Take 1 tablet by mouth daily.         . OXYCODONE-ACETAMINOPHEN 5-325 MG PO TABS   Oral   Take 1-2 tablets by mouth every 6 (six) hours as  needed for pain. Do not drive or operate machinery while taking this medicine   23 tablet   0   . OXYCODONE HCL 5 MG PO TABS   Oral   Take 1 tablet (5 mg total) by mouth every 6 (six) hours as needed for pain.   20 tablet   0     BP 133/55  Pulse 83  Temp 97.9 F (36.6 C) (Oral)  Resp 17  Ht 5\' 11"  (1.803 m)  Wt 165 lb (74.844 kg)  BMI 23.01 kg/m2  SpO2 99%  Physical Exam  Nursing note and vitals reviewed. Constitutional: He is oriented to person, place, and time. He appears well-developed and well-nourished.  HENT:  Head: Normocephalic and atraumatic.  Eyes: EOM are normal.  Neck: Normal range of motion.  Cardiovascular: Normal rate, regular rhythm, normal heart sounds and intact distal pulses.   Pulmonary/Chest: Effort normal and breath sounds normal. No respiratory distress.  Abdominal: Soft. He exhibits no distension. There is no tenderness.  Musculoskeletal: He exhibits tenderness.       Right knee: He exhibits decreased range of motion. He exhibits no swelling, no effusion, no ecchymosis and no deformity. tenderness found.  Back:       Diffuse pain and tenderness in R knee.  Neurological: He is alert and oriented to person, place, and time.  Skin: Skin is warm and dry.  Psychiatric: He has a normal mood and affect. Judgment normal.    ED Course  Procedures (including critical care time)  Labs Reviewed - No data to display Dg Knee Complete 4 Views Right  10/28/2012  *RADIOLOGY REPORT*  Clinical Data: Right knee pain after a fall out of a deer stand.  RIGHT KNEE - COMPLETE 4+ VIEW  Comparison: Radiographs dated 07/31/2012  Findings: There is no fracture, dislocation, or joint effusion. Minimal osteophyte formation on the patella.  IMPRESSION: No acute abnormality.   Original Report Authenticated By: Francene Boyers, M.D.      1. Right knee sprain   2. Lumbar strain       MDM  knee immobilizer, ice ibuprofen, rx-percocet, 20 F/u with dr.  Stana Bunting, PA 10/28/12 1416

## 2012-10-28 NOTE — ED Notes (Signed)
Patient with no complaints at this time. Respirations even and unlabored. Skin warm/dry. Discharge instructions reviewed with patient at this time. Patient given opportunity to voice concerns/ask questions. Patient discharged at this time and left Emergency Department with steady gait.   

## 2012-10-28 NOTE — ED Notes (Signed)
Pt reports was in a deer stand approx 6 or 7 feet off the ground and fell.  C/O pain to r knee and lower back.

## 2012-10-30 NOTE — ED Provider Notes (Signed)
Medical screening examination/treatment/procedure(s) were performed by non-physician practitioner and as supervising physician I was immediately available for consultation/collaboration.   Shelda Jakes, MD 10/30/12 2042

## 2012-11-02 ENCOUNTER — Encounter (HOSPITAL_COMMUNITY): Payer: Self-pay

## 2012-11-02 ENCOUNTER — Emergency Department (HOSPITAL_COMMUNITY): Payer: Self-pay

## 2012-11-02 ENCOUNTER — Emergency Department (HOSPITAL_COMMUNITY)
Admission: EM | Admit: 2012-11-02 | Discharge: 2012-11-02 | Disposition: A | Discharge status: Home / Self Care | Payer: Self-pay | Attending: Emergency Medicine | Admitting: Emergency Medicine

## 2012-11-02 DIAGNOSIS — W1789XA Other fall from one level to another, initial encounter: Secondary | ICD-10-CM | POA: Insufficient documentation

## 2012-11-02 DIAGNOSIS — Z8711 Personal history of peptic ulcer disease: Secondary | ICD-10-CM | POA: Insufficient documentation

## 2012-11-02 DIAGNOSIS — S335XXA Sprain of ligaments of lumbar spine, initial encounter: Secondary | ICD-10-CM | POA: Insufficient documentation

## 2012-11-02 DIAGNOSIS — S39012A Strain of muscle, fascia and tendon of lower back, initial encounter: Secondary | ICD-10-CM

## 2012-11-02 DIAGNOSIS — Y9389 Activity, other specified: Secondary | ICD-10-CM | POA: Insufficient documentation

## 2012-11-02 DIAGNOSIS — Z8659 Personal history of other mental and behavioral disorders: Secondary | ICD-10-CM | POA: Insufficient documentation

## 2012-11-02 DIAGNOSIS — Y9289 Other specified places as the place of occurrence of the external cause: Secondary | ICD-10-CM | POA: Insufficient documentation

## 2012-11-02 DIAGNOSIS — F17200 Nicotine dependence, unspecified, uncomplicated: Secondary | ICD-10-CM | POA: Insufficient documentation

## 2012-11-02 DIAGNOSIS — Y999 Unspecified external cause status: Secondary | ICD-10-CM | POA: Insufficient documentation

## 2012-11-02 MED ORDER — OXYCODONE-ACETAMINOPHEN 5-325 MG PO TABS
1.0000 | ORAL_TABLET | Freq: Once | ORAL | Status: AC
  Administered 2012-11-02: 1 via ORAL
  Filled 2012-11-02: qty 1

## 2012-11-02 MED ORDER — OXYCODONE-ACETAMINOPHEN 5-325 MG PO TABS: 1.0000 | ORAL_TABLET | ORAL | Status: DC | PRN

## 2012-11-02 NOTE — ED Notes (Signed)
Pt reports falling from a deer stand on Jan 1.  Pt reports coming here to be seen.  Pt reports being given oxycodone for pain but reports that it is not helping him.

## 2012-11-02 NOTE — ED Provider Notes (Signed)
History     CSN: [id number]  Arrival date & time 11/02/12  1346   First MD Initiated Contact with Patient 11/02/12 1407      Chief Complaint  Patient presents with  . Knee Pain     Patient is a 52 y.o. male presenting with back pain. The history is provided by the patient.  Back Pain  This is a recurrent problem. The current episode started more than 2 days ago. The problem occurs constantly. The problem has been gradually worsening. The pain is associated with falling. The pain is present in the lumbar spine. The quality of the pain is described as shooting. The pain radiates to the right thigh. The pain is moderate. The symptoms are aggravated by certain positions. The pain is the same all the time. Pertinent negatives include no chest pain, no fever, no headaches, no abdominal pain, no bowel incontinence, no bladder incontinence and no weakness. Treatments tried: rest. The treatment provided no relief.  pt reports he fell from deer stand on 10/28/2012 He reports he was coming off stand and fell "about 5 feet" landing in his legs.  No head injury.  No headache.  No neck pain.  No LOC.  He was seen later that day in the ED for pain in his right knee due to fall.  He reports xray was negative.  He reports pain in right knee is not improved and now he has pain in his low back from the fall that seems to be worsening He reports it hurts to walk due to pain in his leg and his "leg gave out" due to pain  Past Medical History  Diagnosis Date  . Back pain   . History of stomach ulcers   . Depression     Past Surgical History  Procedure Date  . Appendectomy   . Knee surgery     No family history on file.  History  Substance Use Topics  . Smoking status: Never Smoker   . Smokeless tobacco: Not on file  . Alcohol Use: No      Review of Systems  Constitutional: Negative for fever.  HENT: Negative for neck pain.   Respiratory: Negative for chest tightness and shortness of breath.    Cardiovascular: Negative for chest pain.  Gastrointestinal: Negative for abdominal pain and bowel incontinence.  Genitourinary: Negative for bladder incontinence.  Musculoskeletal: Positive for back pain.  Neurological: Negative for weakness and headaches.  Psychiatric/Behavioral: Negative for agitation.  All other systems reviewed and are negative.    Allergies  Review of patient's allergies indicates no known allergies.  Home Medications   Current Outpatient Rx  Name  Route  Sig  Dispense  Refill  . BC HEADACHE POWDER PO   Oral   Take 1 packet by mouth 2 (two) times daily as needed. Arthritis in back and knees.         Marland Kitchen VITAMIN B-12 CR PO   Oral   Take 1 tablet by mouth daily.         . OXYCODONE HCL 5 MG PO TABS   Oral   Take 1 tablet (5 mg total) by mouth every 6 (six) hours as needed for pain.   20 tablet   0   . OXYCODONE-ACETAMINOPHEN 5-325 MG PO TABS   Oral   Take 1-2 tablets by mouth every 6 (six) hours as needed for pain. Do not drive or operate machinery while taking this medicine   23 tablet  0     BP 169/88  Pulse 107  Temp 98.5 F (36.9 C) (Oral)  Resp 20  Ht 5\' 11"  (1.803 m)  Wt 160 lb (72.576 kg)  BMI 22.32 kg/m2  SpO2 98%  Physical Exam CONSTITUTIONAL: Well developed/well nourished HEAD AND FACE: Normocephalic/atraumatic EYES: EOMI/PERRL ENMT: Mucous membranes moist NECK: supple no meningeal signs SPINE:lumbar spine tenderness.  Patient is awake/alert, no distress, appropriate for age, maex4 and no lethargy is noted, no other spinal tenderness noted CV: S1/S2 noted, no murmurs/rubs/gallops noted LUNGS: Lungs are clear to auscultation bilaterally, no apparent distress ABDOMEN: soft, nontender, no rebound or guarding GU:no cva tenderness NEURO: Pt is awake/alert, moves all extremitiesx4. GCS 15.  He is ambulatory but limited due to pain in right knee/back equal distal motor: hip flexion/knee flexion/extension, ankle dorsi/plantar  flexion, great toe extension intact bilaterally, no clonus bilaterally, plantar reflex appropriate, no apparent sensory deficit in any dermatome.   EXTREMITIES: pulses normal, full ROM. Tenderness to right patella but no deformity and full ROM noted.  All other extremities/joints palpated/ranged and nontender SKIN: warm, color normal PSYCH: no abnormalities of mood noted  ED Course  Procedures  2:45 PM Will image lumbar spine since this pain has progressed since fall.  Will defer further workup of right knee pain though could benefit from crutches.  He is already using knee immobilizer  Will prescribe short course of pain meds since he reinjured his back from fall.  Referred for outpatient management  MDM  Nursing notes including past medical history and social history reviewed and considered in documentation xrays reviewed and considered Previous records reviewed and considered - recent ED visit reviewed, negative right knee xray         Joya Gaskins, MD 11/02/12 1526

## 2013-11-24 ENCOUNTER — Emergency Department (HOSPITAL_COMMUNITY): Payer: Self-pay

## 2013-11-24 ENCOUNTER — Emergency Department (HOSPITAL_COMMUNITY)
Admission: EM | Admit: 2013-11-24 | Discharge: 2013-11-24 | Disposition: A | Discharge status: Home / Self Care | Payer: Self-pay | Attending: Emergency Medicine | Admitting: Emergency Medicine

## 2013-11-24 ENCOUNTER — Encounter (HOSPITAL_COMMUNITY): Payer: Self-pay | Admitting: Emergency Medicine

## 2013-11-24 DIAGNOSIS — I509 Heart failure, unspecified: Secondary | ICD-10-CM | POA: Insufficient documentation

## 2013-11-24 DIAGNOSIS — Z8719 Personal history of other diseases of the digestive system: Secondary | ICD-10-CM | POA: Insufficient documentation

## 2013-11-24 DIAGNOSIS — J4489 Other specified chronic obstructive pulmonary disease: Secondary | ICD-10-CM | POA: Insufficient documentation

## 2013-11-24 DIAGNOSIS — S4980XA Other specified injuries of shoulder and upper arm, unspecified arm, initial encounter: Secondary | ICD-10-CM | POA: Insufficient documentation

## 2013-11-24 DIAGNOSIS — S0003XA Contusion of scalp, initial encounter: Secondary | ICD-10-CM | POA: Insufficient documentation

## 2013-11-24 DIAGNOSIS — W19XXXA Unspecified fall, initial encounter: Secondary | ICD-10-CM

## 2013-11-24 DIAGNOSIS — S199XXA Unspecified injury of neck, initial encounter: Secondary | ICD-10-CM

## 2013-11-24 DIAGNOSIS — J449 Chronic obstructive pulmonary disease, unspecified: Secondary | ICD-10-CM | POA: Insufficient documentation

## 2013-11-24 DIAGNOSIS — R110 Nausea: Secondary | ICD-10-CM | POA: Insufficient documentation

## 2013-11-24 DIAGNOSIS — Z79899 Other long term (current) drug therapy: Secondary | ICD-10-CM | POA: Insufficient documentation

## 2013-11-24 DIAGNOSIS — R42 Dizziness and giddiness: Secondary | ICD-10-CM | POA: Insufficient documentation

## 2013-11-24 DIAGNOSIS — R296 Repeated falls: Secondary | ICD-10-CM | POA: Insufficient documentation

## 2013-11-24 DIAGNOSIS — R51 Headache: Secondary | ICD-10-CM | POA: Insufficient documentation

## 2013-11-24 DIAGNOSIS — M6281 Muscle weakness (generalized): Secondary | ICD-10-CM | POA: Insufficient documentation

## 2013-11-24 DIAGNOSIS — S46909A Unspecified injury of unspecified muscle, fascia and tendon at shoulder and upper arm level, unspecified arm, initial encounter: Secondary | ICD-10-CM | POA: Insufficient documentation

## 2013-11-24 DIAGNOSIS — S1093XA Contusion of unspecified part of neck, initial encounter: Secondary | ICD-10-CM

## 2013-11-24 DIAGNOSIS — S0993XA Unspecified injury of face, initial encounter: Secondary | ICD-10-CM | POA: Insufficient documentation

## 2013-11-24 DIAGNOSIS — S0083XA Contusion of other part of head, initial encounter: Secondary | ICD-10-CM

## 2013-11-24 DIAGNOSIS — Y939 Activity, unspecified: Secondary | ICD-10-CM | POA: Insufficient documentation

## 2013-11-24 DIAGNOSIS — R209 Unspecified disturbances of skin sensation: Secondary | ICD-10-CM | POA: Insufficient documentation

## 2013-11-24 DIAGNOSIS — Y929 Unspecified place or not applicable: Secondary | ICD-10-CM | POA: Insufficient documentation

## 2013-11-24 MED ORDER — KETOROLAC TROMETHAMINE 30 MG/ML IJ SOLN
30.0000 mg | Freq: Once | INTRAMUSCULAR | Status: AC
  Administered 2013-11-24: 30 mg via INTRAMUSCULAR
  Filled 2013-11-24: qty 1

## 2013-11-24 NOTE — ED Provider Notes (Signed)
CSN: [id number]     Arrival date & time 11/24/13  1634 History  This chart was scribed for Gerhard Munch, MD by Dorothey Baseman, ED Scribe. This patient was seen in room APA06/APA06 and the patient's care was started at 5:02 PM.    Chief Complaint  Patient presents with  . Fall   The history is provided by the patient. No language interpreter was used.   HPI Comments: Carl Adkins is a 53 y.o. male who presents to the Emergency Department complaining of a fall that occurred earlier today, but states that he has had multiple falls over the past 2 weeks. Patient states that he fell 2 days ago at [facility] ED, where he was being seen for another fall, and he told hospital personnel about the incident, but "they did not do anything about it." Patient is complaining of a constant pain to the left shoulder and left side of the neck with a hematoma to the left side of the forehead with associated pain to the area, lightheadedness, nausea, and some numbness to the right forearm. He reports some associated weakness to the bilateral knees that is normal for him, but has been progressively worsening and states that he has not been ambulatory secondary to some disequilibrium, but that he is able to bear weight and uses a walker for daily ambulation. Patient states that he last took pain medication (Percocet) 2 days ago. He denies syncope, hemiparesis, incontinence. He denies history of neck injuries. Patient reports a history of CHF and COPD. Patient has a history of chronic back pain and stomach ulcers.   Past Medical History  Diagnosis Date  . Back pain   . History of stomach ulcers   . Depression    Past Surgical History  Procedure Laterality Date  . Appendectomy    . Knee surgery     No family history on file. History  Substance Use Topics  . Smoking status: Never Smoker   . Smokeless tobacco: Not on file  . Alcohol Use: No    Review of Systems  Constitutional:       Per HPI, otherwise  negative  HENT:       Per HPI, otherwise negative  Respiratory:       Per HPI, otherwise negative  Cardiovascular:       Per HPI, otherwise negative  Gastrointestinal: Positive for nausea. Negative for vomiting.  Endocrine:       Negative aside from HPI  Genitourinary:       Neg aside from HPI   Musculoskeletal: Positive for arthralgias (left shoulder) and neck pain.       Per HPI, otherwise negative  Skin: Positive for color change (hematoma, left forehead).  Neurological: Positive for weakness (knees, baseline), light-headedness, numbness and headaches. Negative for syncope.       Disequilibrium    Allergies  Review of patient's allergies indicates no known allergies.  Home Medications   Current Outpatient Rx  Name  Route  Sig  Dispense  Refill  . Cyanocobalamin (VITAMIN B-12 CR PO)   Oral   Take 1 tablet by mouth daily.         Marland Kitchen oxyCODONE-acetaminophen (PERCOCET/ROXICET) 5-325 MG per tablet   Oral   Take 1 tablet by mouth every 4 (four) hours as needed for pain.   3 tablet   0    Triage Vitals: BP 138/79  Pulse 93  Temp(Src) 99.1 F (37.3 C) (Oral)  Resp 20  Ht 5\' 11"  (  1.803 m)  Wt 165 lb (74.844 kg)  BMI 23.02 kg/m2  SpO2 96%  Physical Exam  Nursing note and vitals reviewed. Constitutional: He is oriented to person, place, and time. He appears well-developed. No distress.  HENT:  Head: Normocephalic and atraumatic.  Eyes: Conjunctivae and EOM are normal.  Cardiovascular: Normal rate and regular rhythm.   Pulmonary/Chest: Effort normal. No stridor. No respiratory distress.  Abdominal: Soft. Bowel sounds are normal. He exhibits no distension. There is no tenderness.  Musculoskeletal: He exhibits no edema.  Tenderness to palpation about the left hip, but it is grossly stable.   Tenderness to palpation to the left shoulder. Bilateral shoulders are intact. No tenderness to the left elbow or wrist.   Neurological: He is alert and oriented to person, place,  and time.  Skin: Skin is warm and dry.  Psychiatric: He has a normal mood and affect.    ED Course  Procedures (including critical care time)  COORDINATION OF CARE: 5:10 PM- Will order x-rays of the left shoulder and pelvis and a CT of the C spine. Will order Toradol to manage symptoms. Discussed treatment plan with patient at bedside and patient verbalized agreement.     Labs Review Labs Reviewed - No data to display  Imaging Review Dg Pelvis 1-2 Views  11/24/2013   CLINICAL DATA:  Multiple falls.  Left pelvic pain.  EXAM: PELVIS - 1-2 VIEW  COMPARISON:  11/20/2013  FINDINGS: Pelvic bony ring is intact. There is stool in the lower abdomen. Degenerative changes involving the right superior acetabulum which appears unchanged. Gas in the rectum. Symmetric appearance of the SI joints.  IMPRESSION: No acute bone abnormality in the pelvis.   Electronically Signed   By: Richarda OverlieAdam  Henn M.D.   On: 11/24/2013 18:21   Ct Cervical Spine Wo Contrast  11/24/2013   CLINICAL DATA:  Multiple falls and and posterior neck pain.  EXAM: CT CERVICAL SPINE WITHOUT CONTRAST  TECHNIQUE: Multidetector CT imaging of the cervical spine was performed without intravenous contrast. Multiplanar CT image reconstructions were also generated.  COMPARISON:  Head CT 11/22/2013  FINDINGS: Mucosal disease in the left maxillary sinus. Mastoid air cells are clear. Pterygoid plates are intact. No evidence for an apical pneumothorax. There is mild apical lung scarring. Negative for an acute fracture or dislocation. No significant soft tissue swelling. Normal alignment of the cervical spine. Degenerative changes, particularly at C1-C2. Vertebral body heights are maintained.  IMPRESSION: No acute bone abnormality in the cervical spine. Mild degenerative changes.  Left maxillary sinus disease.   Electronically Signed   By: Richarda OverlieAdam  Henn M.D.   On: 11/24/2013 18:48   Dg Shoulder Left  11/24/2013   CLINICAL DATA:  Multiple falls and left  shoulder pain.  EXAM: LEFT SHOULDER - 2+ VIEW  COMPARISON:  11/22/2013  FINDINGS: Left shoulder is located. Negative for a fracture. Left AC joint is intact.  IMPRESSION: No acute bone abnormality to the left shoulder.   Electronically Signed   By: Richarda OverlieAdam  Henn M.D.   On: 11/24/2013 18:22    EKG Interpretation   None       MDM  This patient presents of a mechanical fall occurred several days ago.  Exam is awake and alert, neurovascularly intact and hemodynamically stable, speaking clearly, with no evidence of distress. Patient does note a history of chronic disequilibrium, with frequent fall. Today's evaluation is largely reassuring, and x-rays, CT scan of his neck, or unremarkable.  Patient was discharged in stable  condition to follow up with his primary care team.  Gerhard Munch, MD 11/24/13 [phone]

## 2013-11-24 NOTE — ED Notes (Signed)
Pt states he was at a local hospital four days ago. States while he was there he fell. States he told the staff but they did not do anything about it. Complain of pain in left shoulder and a headache

## 2013-11-24 NOTE — ED Notes (Signed)
Pt remains in xray.

## 2013-11-24 NOTE — Discharge Instructions (Signed)
As discussed, it is important that you follow up as soon as possible with your physician for continued management of your condition. ° °If you develop any new, or concerning changes in your condition, please return to the emergency department immediately. ° °

## 2014-11-22 IMAGING — CR DG ABDOMEN ACUTE W/ 1V CHEST
3 series · 3 of 3 positions shown · non-contrast
Comparison: [DATE]

CLINICAL DATA: Abdominal pain, nausea

ACUTE ABDOMEN SERIES (ABDOMEN 2 VIEW & CHEST 1 VIEW)

[view not recorded (1 of 3)]
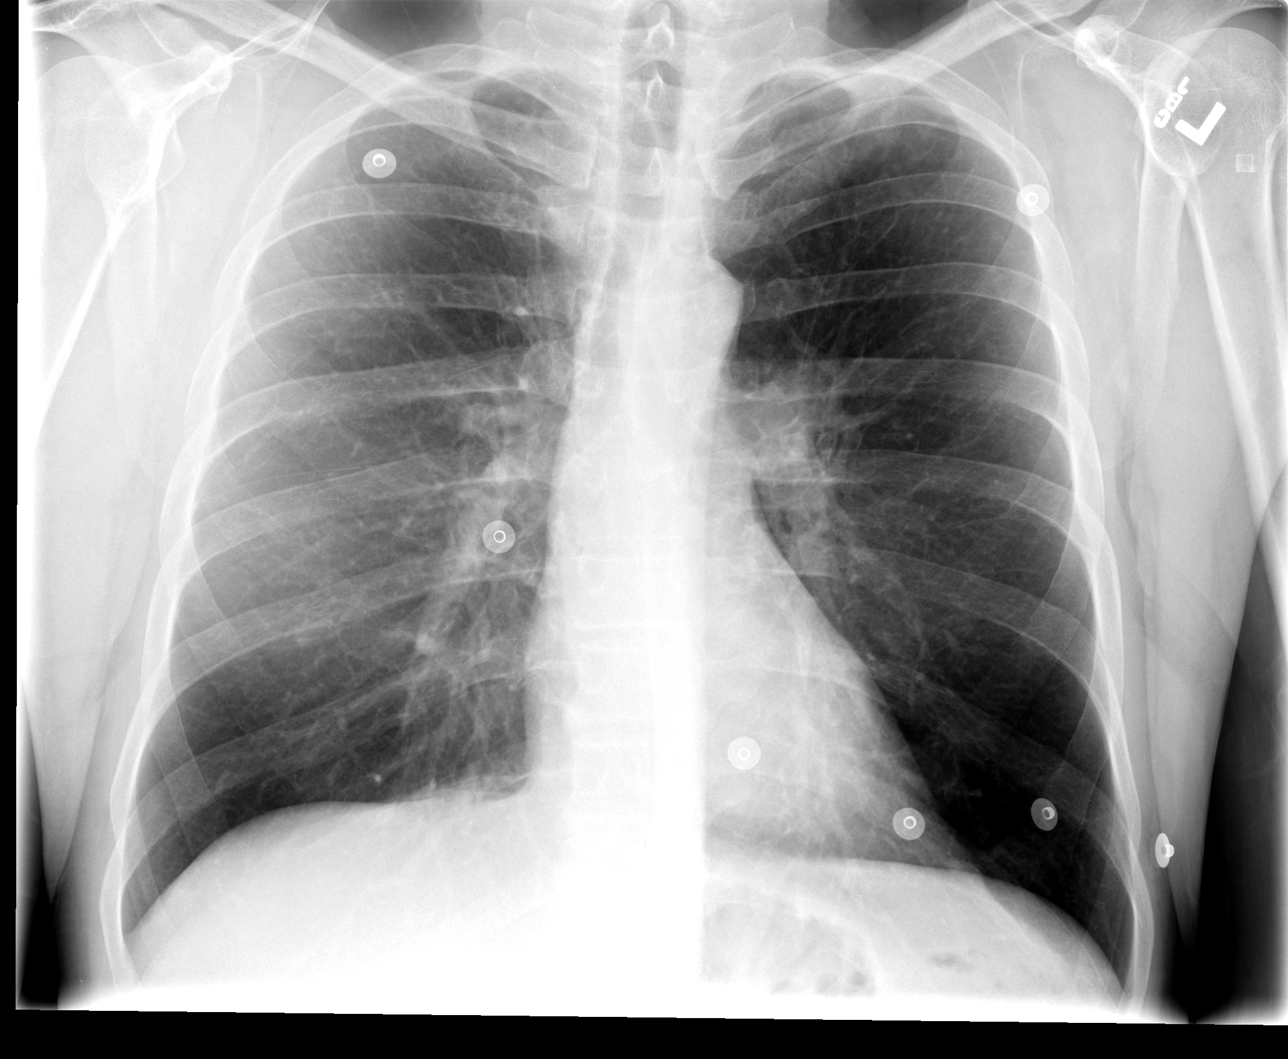

[view not recorded (2 of 3)]
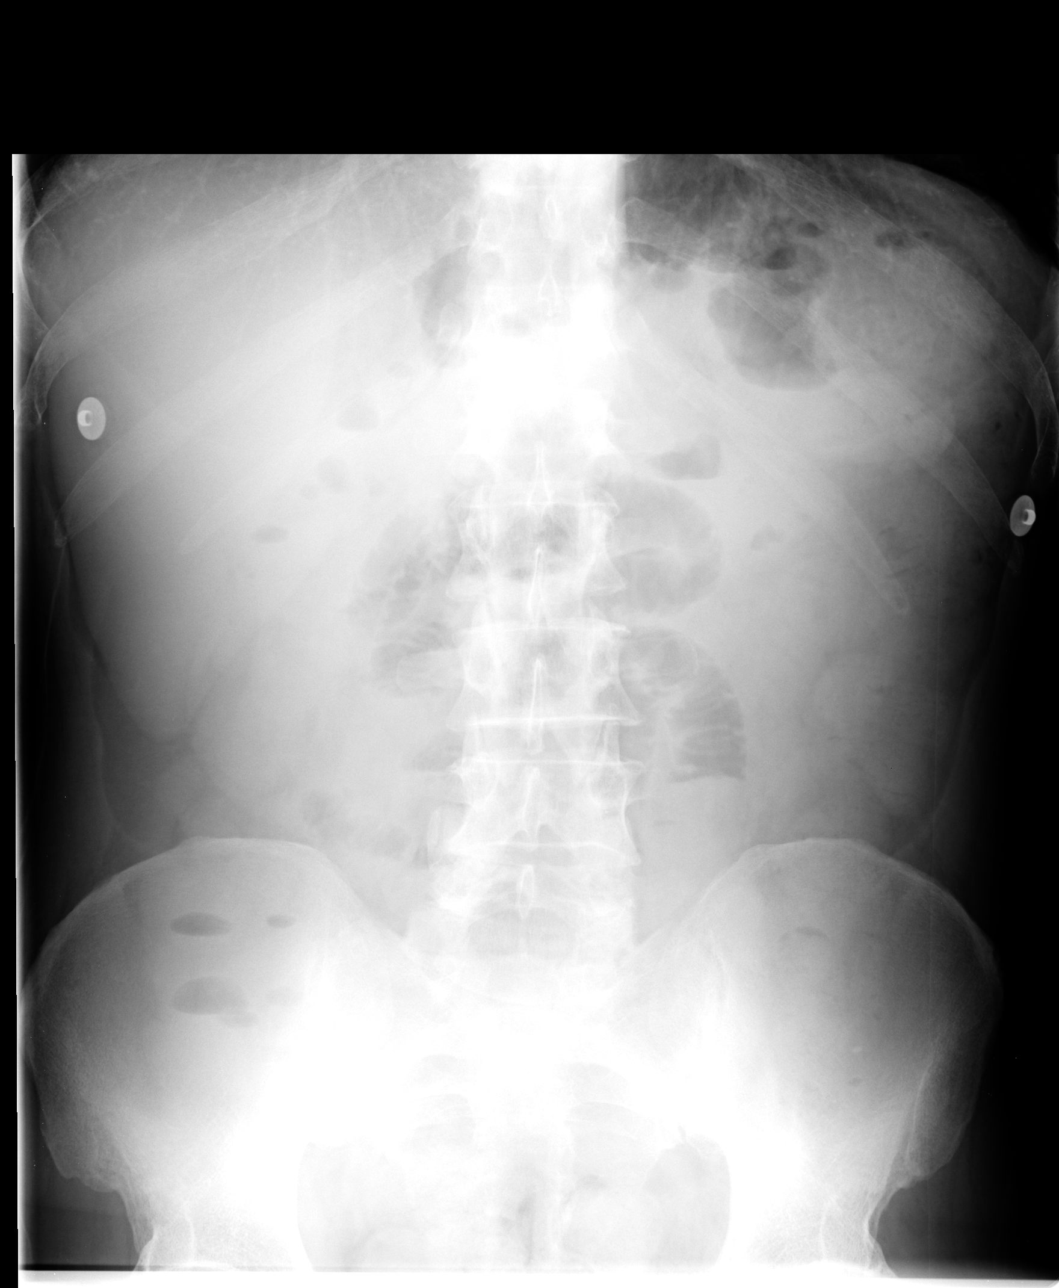

[view not recorded (3 of 3)]
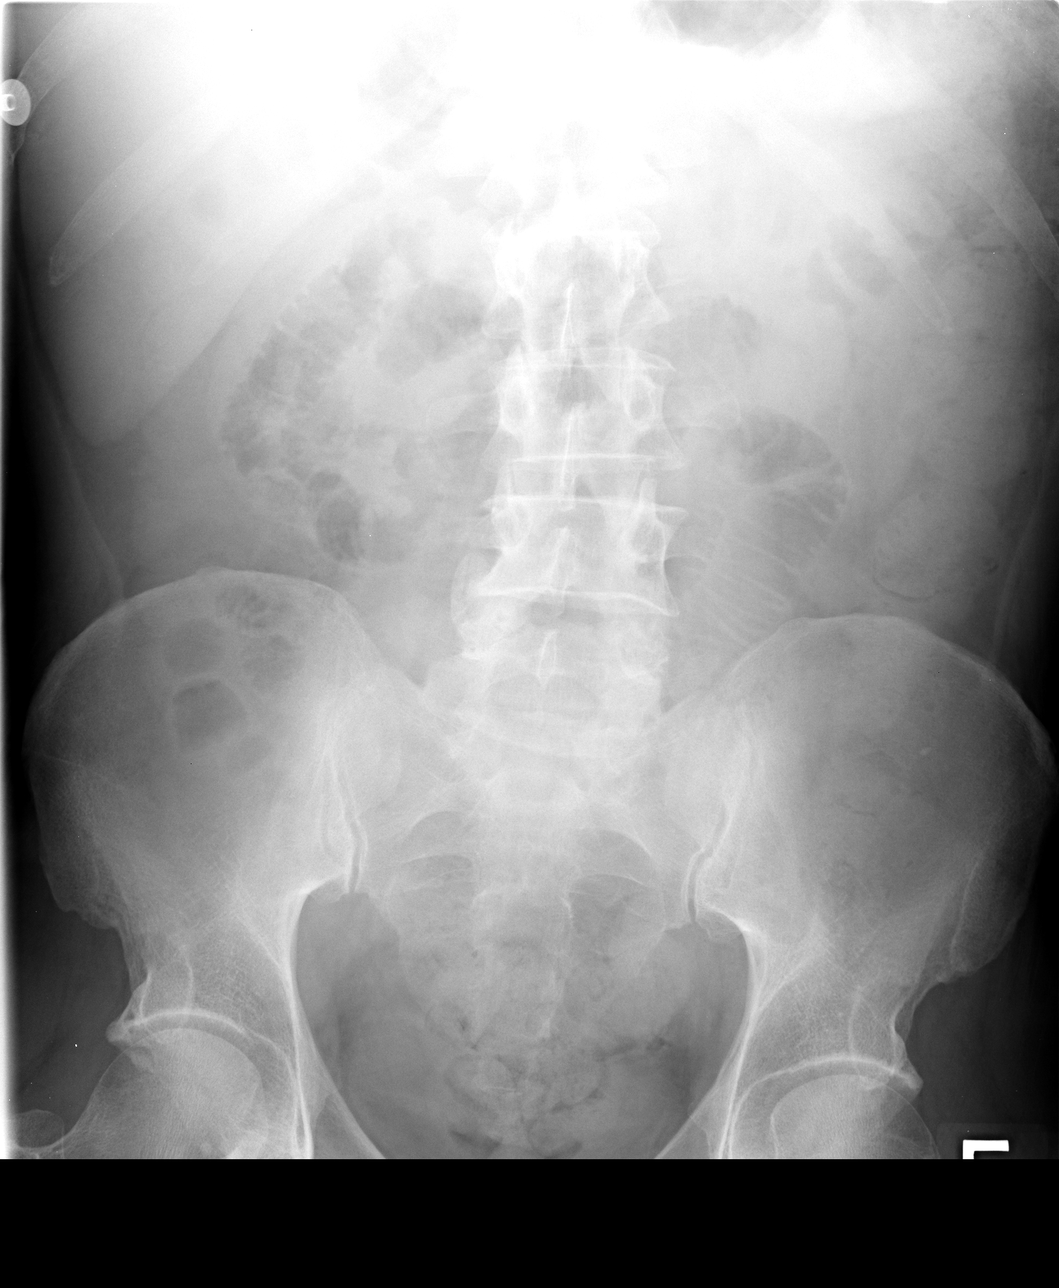

[3 of 3 positions shown; findings below may reference images not displayed]

FINDINGS: Normal heart size and vascularity.  No focal pneumonia,
collapse, consolidation, edema, effusion, or pneumothorax.  Trachea
midline.  No free air.  Slight gaseous distention of small bowel in
the central abdomen with scattered air fluid levels.  Air and stool
throughout the colon.  Appearance is nonspecific.  No definite
obstruction pattern or ileus.  No abnormal osseous finding or
abnormal calcification.
IMPRESSION: No acute chest finding.

Mild gaseous distention of the central small bowel without definite
obstruction or ileus.

No free air

## 2014-12-15 ENCOUNTER — Emergency Department (HOSPITAL_COMMUNITY)
Admission: EM | Admit: 2014-12-15 | Discharge: 2014-12-15 | Disposition: A | Discharge status: Home / Self Care | Payer: Self-pay | Attending: Emergency Medicine | Admitting: Emergency Medicine

## 2014-12-15 ENCOUNTER — Encounter (HOSPITAL_COMMUNITY): Payer: Self-pay

## 2014-12-15 ENCOUNTER — Emergency Department (HOSPITAL_COMMUNITY): Payer: Self-pay

## 2014-12-15 DIAGNOSIS — Z8719 Personal history of other diseases of the digestive system: Secondary | ICD-10-CM | POA: Insufficient documentation

## 2014-12-15 DIAGNOSIS — Z8659 Personal history of other mental and behavioral disorders: Secondary | ICD-10-CM | POA: Insufficient documentation

## 2014-12-15 DIAGNOSIS — M5412 Radiculopathy, cervical region: Secondary | ICD-10-CM | POA: Insufficient documentation

## 2014-12-15 DIAGNOSIS — G8929 Other chronic pain: Secondary | ICD-10-CM | POA: Insufficient documentation

## 2014-12-15 DIAGNOSIS — Z720 Tobacco use: Secondary | ICD-10-CM | POA: Insufficient documentation

## 2014-12-15 DIAGNOSIS — H538 Other visual disturbances: Secondary | ICD-10-CM | POA: Insufficient documentation

## 2014-12-15 DIAGNOSIS — I509 Heart failure, unspecified: Secondary | ICD-10-CM | POA: Insufficient documentation

## 2014-12-15 DIAGNOSIS — R51 Headache: Secondary | ICD-10-CM | POA: Insufficient documentation

## 2014-12-15 HISTORY — DX: Dorsalgia, unspecified: M54.9

## 2014-12-15 HISTORY — DX: Other chronic pain: G89.29

## 2014-12-15 HISTORY — DX: Heart failure, unspecified: I50.9

## 2014-12-15 HISTORY — DX: Cervicalgia: M54.2

## 2014-12-15 HISTORY — DX: Radiculopathy, lumbar region: M54.16

## 2014-12-15 HISTORY — DX: Pain in unspecified knee: M25.569

## 2014-12-15 LAB — I-STAT CHEM 8, ED
BUN: 8 mg/dL (ref 6–23)
CHLORIDE: 102 mmol/L (ref 96–112)
CREATININE: 1 mg/dL (ref 0.50–1.35)
Calcium, Ion: 1.21 mmol/L (ref 1.12–1.23)
GLUCOSE: 100 mg/dL — AB (ref 70–99)
HCT: 50 % (ref 39.0–52.0)
Hemoglobin: 17 g/dL (ref 13.0–17.0)
POTASSIUM: 4 mmol/L (ref 3.5–5.1)
Sodium: 140 mmol/L (ref 135–145)
TCO2: 25 mmol/L (ref 0–100)

## 2014-12-15 MED ORDER — CYCLOBENZAPRINE HCL 10 MG PO TABS: 10.0000 mg | ORAL_TABLET | Freq: Three times a day (TID) | ORAL | Status: DC | PRN

## 2014-12-15 MED ORDER — CYCLOBENZAPRINE HCL 10 MG PO TABS
10.0000 mg | ORAL_TABLET | Freq: Once | ORAL | Status: AC
  Administered 2014-12-15: 10 mg via ORAL
  Filled 2014-12-15: qty 1

## 2014-12-15 MED ORDER — IOHEXOL 350 MG/ML SOLN
80.0000 mL | Freq: Once | INTRAVENOUS | Status: AC | PRN
  Administered 2014-12-15: 80 mL via INTRAVENOUS

## 2014-12-15 MED ORDER — PREDNISONE 10 MG PO TABS: ORAL_TABLET | ORAL | Status: DC

## 2014-12-15 MED ORDER — OXYCODONE-ACETAMINOPHEN 5-325 MG PO TABS
1.0000 | ORAL_TABLET | Freq: Once | ORAL | Status: AC
  Administered 2014-12-15: 1 via ORAL
  Filled 2014-12-15: qty 1

## 2014-12-15 MED ORDER — OXYCODONE-ACETAMINOPHEN 5-325 MG PO TABS: 1.0000 | ORAL_TABLET | ORAL | Status: DC | PRN

## 2014-12-15 NOTE — ED Notes (Signed)
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  Pt says when he leans head to the left, it feels better but says causes pressure behind his eyes.  Pt says pain radiates down his back and down left leg.

## 2014-12-15 NOTE — ED Notes (Signed)
Report given to Yvette RN

## 2014-12-15 NOTE — Discharge Instructions (Signed)
Cervical Radiculopathy °Cervical radiculopathy means a nerve in the neck is pinched or bruised. This can cause pain or loss of feeling (numbness) that runs from your neck to your arm and fingers. °HOME CARE  °· Put ice on the injured or painful area. °¨ Put ice in a plastic bag. °¨ Place a towel between your skin and the bag. °¨ Leave the ice on for 15-20 minutes, 03-04 times a day, or as told by your doctor. °· If ice does not help, you can try using heat. Take a warm shower or bath, or use a hot water bottle as told by your doctor. °· You may try a gentle neck and shoulder massage. °· Use a flat pillow when you sleep. °· Only take medicines as told by your doctor. °· Keep all physical therapy visits as told by your doctor. °· If you are given a soft collar, wear it as told by your doctor. °GET HELP RIGHT AWAY IF:  °· Your pain gets worse and is not controlled with medicine. °· You lose feeling or feel weak in your hand, arm, face, or leg. °· You have a fever or stiff neck. °· You cannot control when you poop or pee (incontinence). °· You have trouble with walking, balance, or speaking. °MAKE SURE YOU:  °· Understand these instructions. °· Will watch your condition. °· Will get help right away if you are not doing well or get worse. °Document Released: 10/03/2011 Document Revised: 01/06/2012 Document Reviewed: 10/03/2011 °ExitCare® Patient Information ©2015 [employer]. This information is not intended to replace advice given to you by your health care provider. Make sure you discuss any questions you have with your health care provider. ° °

## 2014-12-15 NOTE — ED Provider Notes (Signed)
CSN: [id number]     Arrival date & time 12/15/14  1622 History   First MD Initiated Contact with Patient 12/15/14 1741     Chief Complaint  Patient presents with  . Neck Pain     (Consider location/radiation/quality/duration/timing/severity/associated sxs/prior Treatment) HPI   Carl Adkins is a 54 y.o. male who presents to the Emergency Department complaining of left sided neck pain.  He states that he has sharp pain from his left neck radiating into the shoulder, arm and into the fingers.  He states the pain has been persistent for months, but reports sudden onset of left sided headache with bilateral blurred vision with certain positions.  He states that this is new but resolves when he straightens his head and looks forward.  He has been taking OTC medications without relief.  He states that he does not have insurance or PMD at this time.    Past Medical History  Diagnosis Date  . History of stomach ulcers   . Depression   . CHF (congestive heart failure)   . Chronic back pain   . Chronic neck pain   . Lumbar radicular pain   . Chronic knee pain    Past Surgical History  Procedure Laterality Date  . Appendectomy    . Knee surgery    . Joint replacement     No family history on file. History  Substance Use Topics  . Smoking status: Current Some Day Smoker    Types: Cigars  . Smokeless tobacco: Not on file  . Alcohol Use: No    Review of Systems  Constitutional: Negative for fever.  Eyes: Positive for visual disturbance.  Respiratory: Negative for shortness of breath.   Gastrointestinal: Negative for vomiting, abdominal pain and constipation.  Genitourinary: Negative for dysuria, hematuria, flank pain, decreased urine volume and difficulty urinating.  Musculoskeletal: Positive for gait problem and neck pain. Negative for back pain, joint swelling and neck stiffness.  Skin: Negative for rash.  Neurological: Positive for headaches. Negative for syncope, facial  asymmetry, speech difficulty, weakness and numbness.  All other systems reviewed and are negative.     Allergies  Aspirin  Home Medications   Prior to Admission medications   Not on File   BP 130/82 mmHg  Pulse 70  Temp(Src) 98.2 F (36.8 C) (Oral)  Resp 18  Ht  (1.803 m)  Wt 165 lb (74.844 kg)  BMI 23.02 kg/m2  SpO2 100% Physical Exam  Constitutional: He is oriented to person, place, and time. He appears well-developed and well-nourished. No distress.  HENT:  Head: Normocephalic and atraumatic.  Eyes: Conjunctivae and EOM are normal. Pupils are equal, round, and reactive to light.  Neck: Phonation normal. Neck supple. Normal carotid pulses and no JVD present. Muscular tenderness present. Carotid bruit is not present.  Cardiovascular: Normal rate, regular rhythm, normal heart sounds and intact distal pulses.   No murmur heard. Pulmonary/Chest: Effort normal and breath sounds normal. No respiratory distress.  Musculoskeletal: Normal range of motion.       Cervical back: He exhibits bony tenderness. He exhibits normal range of motion and normal pulse.       Back:  ttp along the cervical spine.  No bony deformity or edema. Pain reproduced with abduction of the left arm.  Lymphadenopathy:    He has no cervical adenopathy.  Neurological: He is alert and oriented to person, place, and time. Coordination normal.  Reflex Scores:      Tricep reflexes  are 2+ on the right side and 2+ on the left side.      Bicep reflexes are 2+ on the right side and 2+ on the left side. Skin: Skin is warm and dry.  Nursing note and vitals reviewed.   ED Course  Procedures (including critical care time) Labs Review Labs Reviewed  I-STAT CHEM 8, ED    Imaging Review Ct Angio Neck W/cm &/or Wo/cm  12/15/2014   CLINICAL DATA:  Left-sided neck pain which began 3 days ago. Initial encounter.  EXAM: CT ANGIOGRAPHY NECK  TECHNIQUE: Multidetector CT imaging of the neck was performed using  the standard protocol during bolus administration of intravenous contrast. Multiplanar CT image reconstructions and MIPs were obtained to evaluate the vascular anatomy. Carotid stenosis measurements (when applicable) are obtained utilizing NASCET criteria, using the distal internal carotid diameter as the denominator.  CONTRAST:  80mL OMNIPAQUE IOHEXOL 350 MG/ML SOLN  COMPARISON:  None.  FINDINGS: Aortic arch: Standard branching. Imaged portion shows no evidence of aneurysm or dissection. No significant stenosis of the major arch vessel origins.  Right carotid system: No evidence of dissection, stenosis (50% or greater) or occlusion.  Left carotid system: No evidence of dissection, stenosis (50% or greater) or occlusion.  Vertebral arteries: LEFT vertebral slightly larger. No osseous impingement. No evidence of dissection, stenosis (50% or greater) or occlusion.  Non vascular structures: Lung apices and upper mediastinum are normal. Airway midline. No osseous lesions. Mild cervical spondylosis at C5-C6. No inflammatory lesions in the neck. Shotty cervical lymph nodes. Visualized salivary glands unremarkable.  IMPRESSION: No significant stenosis or vascular occlusion. No evidence for neck dissection.   Electronically Signed   By: Davonna BellingJohn  Curnes M.D.   On: 12/15/2014 19:33     EKG Interpretation None      MDM   Final diagnoses:  Cervical radiculopathy   Consulted Dr. Loreta AveWagner, radiologist who recommended CTA neck with CM based on pt's symtpoms   Pt is well appearing.  Vitals stable.  Sx's are likely related to cervical radiculopathy, but new sx's of headache and visual change is concerning for vascular problem.  Discussed pt with Dr. Clarene DukeMcManus, will order CTA of the neck to evaluate   Reviewed results, discussed with the patient.  He appears stable for d/c. Pt given referral [facility].  rx for flexeril , prednisone and percocet.       Iyah Laguna L. Trisha Mangleriplett, PA-C 12/18/14  0019  Samuel JesterKathleen McManus, DO 12/18/14 [phone]

## 2015-08-25 ENCOUNTER — Ambulatory Visit (INDEPENDENT_AMBULATORY_CARE_PROVIDER_SITE_OTHER): Payer: Self-pay | Admitting: Cardiology

## 2015-08-25 ENCOUNTER — Encounter: Payer: Self-pay | Admitting: Cardiology

## 2015-08-25 ENCOUNTER — Encounter: Payer: Self-pay | Admitting: *Deleted

## 2015-08-25 VITALS — BP 125/77 | HR 72 | Ht 71.0 in | Wt 181.0 lb

## 2015-08-25 DIAGNOSIS — R0602 Shortness of breath: Secondary | ICD-10-CM

## 2015-08-25 NOTE — Patient Instructions (Signed)
   Please call office when your [facility] assistance is approved.  Continue all current medications. Follow up in  3 months.

## 2015-08-25 NOTE — Progress Notes (Signed)
Patient ID: Carl Adkins, male   DOB: 1961/03/20, 54 y.o.   MRN: [id number]     Clinical Summary Carl Adkins is a 54 y.o.male seen today as a new patient for the following medical problems.  1. SOB - denies any chest pain - SOB ongoing for 2 years. [name] - DOE at 1 to 1.5 blocks. Exertion is also limited by severe knee pain.States 2 years ago could walk 2-3 miles - Notes some occasional LE edema, occasional abdominal distension. No orthopnea. Notes subjective weight gain  - breathing somewhat improved with inhalers - hx of tobacco abuse >20 years     Past Medical History  Diagnosis Date  . History of stomach ulcers   . Depression   . CHF (congestive heart failure)   . Chronic back pain   . Chronic neck pain   . Lumbar radicular pain   . Chronic knee pain      Allergies  Allergen Reactions  . Aspirin Other (See Comments)    Not suppose to take due to bleeding ulcers.      Current Outpatient Prescriptions  Medication Sig Dispense Refill  . cyclobenzaprine (FLEXERIL) 10 MG tablet Take 1 tablet (10 mg total) by mouth 3 (three) times daily as needed. 21 tablet 0  . oxyCODONE-acetaminophen (PERCOCET/ROXICET) 5-325 MG per tablet Take 1 tablet by mouth every 4 (four) hours as needed. 15 tablet 0  . predniSONE (DELTASONE) 10 MG tablet Take 6 tablets day one, 5 tablets day two, 4 tablets day three, 3 tablets day four, 2 tablets day five, then 1 tablet day six 21 tablet 0   No current facility-administered medications for this visit.     Past Surgical History  Procedure Laterality Date  . Appendectomy    . Knee surgery    . Joint replacement       Allergies  Allergen Reactions  . Aspirin Other (See Comments)    Not suppose to take due to bleeding ulcers.       No family history on file.   Social History Carl Adkins reports that he has been smoking Cigars.  He does not  have any smokeless tobacco history on file. Carl Adkins reports that he does not drink alcohol.   Review of Systems CONSTITUTIONAL: No weight loss, fever, chills, weakness or fatigue.  HEENT: Eyes: No visual loss, blurred vision, double vision or yellow sclerae.No hearing loss, sneezing, congestion, runny nose or sore throat.  SKIN: No rash or itching.  CARDIOVASCULAR: per HPI RESPIRATORY: No cough or sputum.  GASTROINTESTINAL: No anorexia, nausea, vomiting or diarrhea. No abdominal pain or blood.  GENITOURINARY: No burning on urination, no polyuria NEUROLOGICAL: No headache, dizziness, syncope, paralysis, ataxia, numbness or tingling in the extremities. No change in bowel or bladder control.  MUSCULOSKELETAL: No muscle, back pain, joint pain or stiffness.  LYMPHATICS: No enlarged nodes. No history of splenectomy.  PSYCHIATRIC: No history of depression or anxiety.  ENDOCRINOLOGIC: No reports of sweating, cold or heat intolerance. No polyuria or polydipsia.  Marland Kitchen   Physical Examination Filed Vitals:   08/25/15 0926  BP: 125/77  Pulse: 72   Filed Vitals:   08/25/15 0926  Height:  (1.803 m)  Weight: 181 lb (82.101 kg)    Gen: resting comfortably, no acute distress HEENT: no scleral icterus, pupils equal round and reactive, no palptable cervical adenopathy,  CV: RRR, no  m/r/g, no jvd Resp: Clear to auscultation bilaterally GI: abdomen is soft, non-tender, non-distended, normal bowel sounds, no hepatosplenomegaly MSK: extremities are warm, no edema.  Skin: warm, no rash Neuro:  no focal deficits Psych: appropriate affect     Assessment and Plan  1. SOB - he reports a history of CHF but details are very unclear - [facility] is awaiting [facility] assitance. Once approved will obtain echo to evaluate potential cardiac etiology of his symptoms. Also consider PFTs given his smoking history.    F/u 3 months      Antoine PocheJonathan F. Verna Desrocher, M.D.

## 2015-11-28 ENCOUNTER — Encounter: Payer: Self-pay | Admitting: Cardiology

## 2015-11-28 NOTE — Progress Notes (Signed)
Patient ID: Carl Adkins, male   DOB: [birth year], 55 y.o.   MRN: [id number]    Encounter opened in error

## 2017-01-13 NOTE — Congregational Nurse Program (Signed)
Congregational Nurse Program Note  Date of Encounter: 01/13/2017  Past Medical History: No past medical history on file.  Encounter Details:     CNP Questionnaire - 01/13/17 2348      Patient Demographics   Is this a new or existing patient? New   Patient is considered a/an Not Applicable   Race Caucasian/White     Patient Assistance   Location of Patient Assistance [place] [address]   Patient's financial/insurance status Orange Card/Care Connects   Uninsured Patient ([place] Card/Care Connects) Yes   Interventions Not Applicable   Patient referred to apply for the following financial assistance Medicaid   Food insecurities addressed Not Applicable   Transportation assistance Yes   Type of Assistance [facility]AT   Assistance securing medications No   Educational health offerings Behavioral health;Cardiac disease     Encounter Details   Primary purpose of visit Education/Health Concerns   Was an Emergency Department visit averted? Not Applicable   Does patient have a medical provider? No   Patient referred to Area Agency   Was a mental health screening completed? (GAINS tool) No   Does patient have dental issues? No   Does patient have vision issues? Yes   Was a vision referral made? Yes   Does your patient have an abnormal blood pressure today? No   Since previous encounter, have you referred patient for abnormal blood pressure that resulted in a new diagnosis or medication change? No   Does your patient have an abnormal blood glucose today? No   Since previous encounter, have you referred patient for abnormal blood glucose that resulted in a new diagnosis or medication change? No   Was there a life-saving intervention made? No    Stated he was [facility] [place] and needs assistance with his medications.his friend wilbe taking him to [facility] for a follow up appointment.Also stated he really  needs to have his vision  checked. Filled out application for him to take to the [facility] for help with glasses BP 112/74 HR [address] Leontae Bostock,RN,[place], [phone]   , [place] [phone]

## 2017-01-14 NOTE — Congregational Nurse Program (Signed)
Patient stated that he needed new eye -glasses. Hasn't had an eye exam in 3-4 years. Application filled out for the [facility] and given to patient to take to Mr Aida RaiderJoe Zanetti at [facility].Needs assistance with his medications.told will make appointment to see someone at [facility] tomorrow

## 2017-01-14 NOTE — Patient Instructions (Signed)
Application given to client for the [facility] to see if he can get an eye exam and glasses

## 2017-02-12 IMAGING — CT CT ANGIO NECK
1 of 7 series · 2 of 33 positions shown · IV contrast (omnipaque)
Comparison: None.

CLINICAL DATA: Left-sided neck pain which began 3 days ago. Initial
encounter.

EXAM:
CT ANGIOGRAPHY NECK
TECHNIQUE: Multidetector CT imaging of the neck was performed using the
standard protocol during bolus administration of intravenous
contrast. Multiplanar CT image reconstructions and MIPs were
obtained to evaluate the vascular anatomy. Carotid stenosis
measurements (when applicable) are obtained utilizing NASCET
criteria, using the distal internal carotid diameter as the
denominator.
CONTRAST:  80mL OMNIPAQUE IOHEXOL 350 MG/ML SOLN

[Series 5: cta neck cp portal/pac · axial · portal-venous · 0.52mm/px · z∈[+1127,+1205]mm · 2 of 117 slices shown]
[im 39/117  soft-tissue]
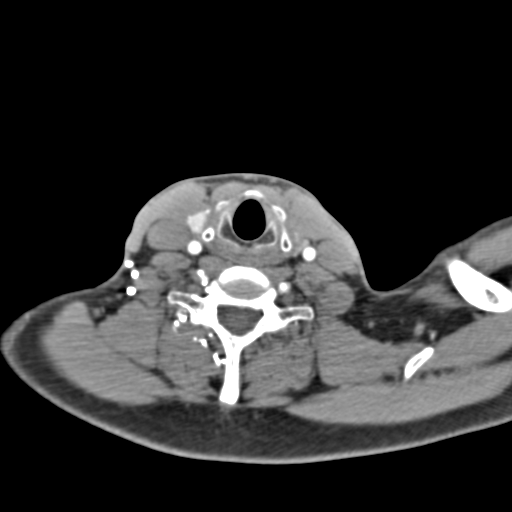
[im 78/117  bone]
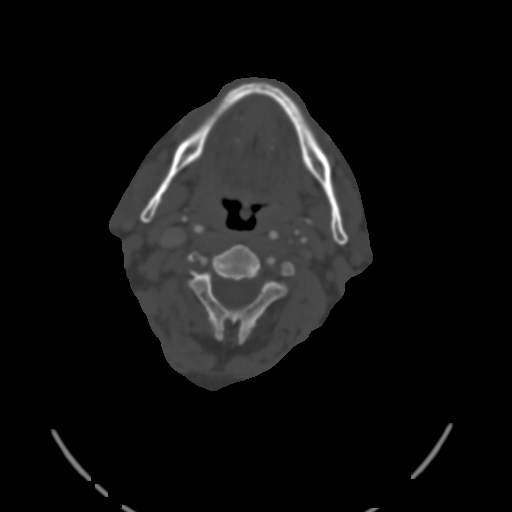

[2 of 33 positions shown; findings below may reference images not displayed]

FINDINGS: Aortic arch: Standard branching. Imaged portion shows no evidence of
aneurysm or dissection. No significant stenosis of the major arch
vessel origins.

Right carotid system: No evidence of dissection, stenosis (50% or
greater) or occlusion.

Left carotid system: No evidence of dissection, stenosis (50% or
greater) or occlusion.

Vertebral arteries: LEFT vertebral slightly larger. No osseous
impingement. No evidence of dissection, stenosis (50% or greater) or
occlusion.

Non vascular structures: Lung apices and upper mediastinum are
normal. Airway midline. No osseous lesions. Mild cervical
spondylosis at C5-C6. No inflammatory lesions in the neck. Shotty
cervical lymph nodes. Visualized salivary glands unremarkable.
IMPRESSION: No significant stenosis or vascular occlusion. No evidence for neck
dissection.

## 2017-03-17 NOTE — Congregational Nurse Program (Signed)
Congregational Nurse Program Note  Date of Encounter: 03/17/2017  Past Medical History: Past Medical History:  Diagnosis Date  . CHF (congestive heart failure) (HCC)   . Chronic back pain   . Chronic knee pain   . Chronic neck pain   . Depression   . History of stomach ulcers   . Lumbar radicular pain     Encounter Details:     CNP Questionnaire - 03/17/17 1442      Patient Demographics   Is this a new or existing patient? New   Patient is considered a/an Not Applicable   Race Caucasian/White     Patient Assistance   Location of Patient Assistance [place]   Patient's financial/insurance status Self-Pay (Uninsured)   Uninsured Patient ([place] [facility]) No   Patient referred to apply for the following financial assistance Not Applicable   Food insecurities addressed Provided food supplies   Transportation assistance No   Assistance securing medications No   Type of Assistance Medication OncologistAssistance Program   Educational health offerings Navigating the healthcare system     Encounter Details   Primary purpose of visit Navigating the Healthcare System   Was an Emergency Department visit averted? No   Does patient have a medical provider? Yes   Patient referred to Other   Was a mental health screening completed? (GAINS tool) No   Does patient have dental issues? No   Does patient have vision issues? No   Does your patient have an abnormal blood pressure today? No   Since previous encounter, have you referred patient for abnormal blood pressure that resulted in a new diagnosis or medication change? No   Does your patient have an abnormal blood glucose today? No   Since previous encounter, have you referred patient for abnormal blood glucose that resulted in a new diagnosis or medication change? No   Was there a life-saving intervention made? No     Client was assisted with applying for the Medication Assistance Program to assist with medications. Client  has a PCP and currently seeking Disablilty.  Telephone call to office of Med[facility]. Client spoke with agent on how to apply and will start the process. Encouraged cleitn to continue to followup with PCP as needed.  Assisted with getting client an appointment to have an eye exam and get new glasses.  Pearletha AlfredJan Burnie Therien,RN [phone].

## 2018-01-23 ENCOUNTER — Emergency Department (HOSPITAL_COMMUNITY): Payer: Self-pay

## 2018-01-23 ENCOUNTER — Emergency Department (HOSPITAL_COMMUNITY)
Admission: EM | Admit: 2018-01-23 | Discharge: 2018-01-23 | Disposition: A | Discharge status: Home / Self Care | Payer: Self-pay | Attending: Emergency Medicine | Admitting: Emergency Medicine

## 2018-01-23 ENCOUNTER — Encounter (HOSPITAL_COMMUNITY): Payer: Self-pay

## 2018-01-23 DIAGNOSIS — I509 Heart failure, unspecified: Secondary | ICD-10-CM | POA: Insufficient documentation

## 2018-01-23 DIAGNOSIS — Z87891 Personal history of nicotine dependence: Secondary | ICD-10-CM | POA: Insufficient documentation

## 2018-01-23 DIAGNOSIS — Z87442 Personal history of urinary calculi: Secondary | ICD-10-CM | POA: Insufficient documentation

## 2018-01-23 DIAGNOSIS — R109 Unspecified abdominal pain: Secondary | ICD-10-CM | POA: Insufficient documentation

## 2018-01-23 HISTORY — DX: Disorder of kidney and ureter, unspecified: N28.9

## 2018-01-23 LAB — URINALYSIS, ROUTINE W REFLEX MICROSCOPIC
BILIRUBIN URINE: NEGATIVE
Glucose, UA: NEGATIVE mg/dL
Hgb urine dipstick: NEGATIVE
Ketones, ur: NEGATIVE mg/dL
Leukocytes, UA: NEGATIVE
NITRITE: NEGATIVE
PH: 5 (ref 5.0–8.0)
Protein, ur: NEGATIVE mg/dL
SPECIFIC GRAVITY, URINE: 1.026 (ref 1.005–1.030)

## 2018-01-23 MED ORDER — TRAMADOL HCL 50 MG PO TABS: 50.0000 mg | ORAL_TABLET | Freq: Four times a day (QID) | ORAL | 0 refills | Status: AC | PRN

## 2018-01-23 MED ORDER — KETOROLAC TROMETHAMINE 30 MG/ML IJ SOLN
30.0000 mg | Freq: Once | INTRAMUSCULAR | Status: AC
  Administered 2018-01-23: 30 mg via INTRAVENOUS
  Filled 2018-01-23: qty 1

## 2018-01-23 MED ORDER — ONDANSETRON HCL 4 MG/2ML IJ SOLN
4.0000 mg | Freq: Once | INTRAMUSCULAR | Status: AC
  Administered 2018-01-23: 4 mg via INTRAVENOUS
  Filled 2018-01-23: qty 2

## 2018-01-23 NOTE — ED Triage Notes (Signed)
Pt reports left flank pain and lower back pain for 3 days. Reports he had a stone last month that he passed.

## 2018-01-23 NOTE — Discharge Instructions (Signed)

## 2018-01-23 NOTE — ED Provider Notes (Signed)
Emergency Department Provider Note   I have reviewed the triage vital signs and the nursing notes.   HISTORY  Chief Complaint Flank Pain   HPI Carl Adkins is a 57 y.o. male with PMH of CHF, chronic lower back pain, and history of kidney stones presents to the emergency department for evaluation of intermittent, severe left flank pain.  Patient states this feels similar to prior kidney stones.  He went to an outside emergency department one month ago and was diagnosed with kidney stone at that time.  Denies any fevers or chills.  No dysuria, hesitancy, urgency.  No hematuria.  Denies any numbness or weakness in the lower extremities.   Past Medical History:  Diagnosis Date  . CHF (congestive heart failure) (HCC)   . Chronic back pain   . Chronic knee pain   . Chronic neck pain   . Depression   . History of stomach ulcers   . Lumbar radicular pain   . Renal disorder    kidney stone    There are no active problems to display for this patient.   Past Surgical History:  Procedure Laterality Date  . APPENDECTOMY    . JOINT REPLACEMENT    . KNEE SURGERY      Current Outpatient Rx  . Order #: [id number] Class: Historical Med  . Order #: [id number] Class: Historical Med  . Order #: [id number] Class: Historical Med  . Order #: [id number] Class: Print    Allergies Aspirin  No family history on file.  Social History Social History   Tobacco Use  . Smoking status: Former Smoker    Packs/day: 0.75    Years: 31.00    Pack years: 23.25    Types: Cigars, Cigarettes    Start date: 09/01/1984    Last attempt to quit: 01/30/2015    Years since quitting: 2.9  . Smokeless tobacco: Never Used  Substance Use Topics  . Alcohol use: No    Alcohol/week: 0.0 oz  . Drug use: Yes    Types: Marijuana    Review of Systems  Constitutional: No fever/chills Eyes: No visual changes. ENT: No sore throat. Cardiovascular: Denies chest pain. Respiratory: Denies shortness of  breath. Gastrointestinal: No abdominal pain.  No nausea, no vomiting.  No diarrhea.  No constipation. Positive flank pain.  Genitourinary: Negative for dysuria. Musculoskeletal: Negative for back pain. Skin: Negative for rash. Neurological: Negative for headaches, focal weakness or numbness.  10-point ROS otherwise negative.  ____________________________________________   PHYSICAL EXAM:  VITAL SIGNS: ED Triage Vitals  Enc Vitals Group     BP 01/23/18 1718 (!) 158/93     Pulse Rate 01/23/18 1718 91     Resp 01/23/18 1718 16     Temp 01/23/18 1718 98.2 F (36.8 C)     Temp Source 01/23/18 1718 Oral     SpO2 01/23/18 1718 98 %     Weight 01/23/18 1719 178 lb (80.7 kg)     Pain Score 01/23/18 1719 7   Constitutional: Alert and oriented. Well appearing and in no acute distress. Eyes: Conjunctivae are normal.  Head: Atraumatic. Nose: No congestion/rhinnorhea. Mouth/Throat: Mucous membranes are moist.  Neck: No stridor.   Cardiovascular: Normal rate, regular rhythm. Good peripheral circulation. Grossly normal heart sounds.   Respiratory: Normal respiratory effort.  No retractions. Lungs CTAB. Gastrointestinal: Soft and nontender. No distention.  Musculoskeletal: No lower extremity tenderness nor edema. No gross deformities of extremities. Neurologic:  Normal speech and language. No gross focal neurologic deficits  are appreciated.  Skin:  Skin is warm, dry and intact. No rash noted.  ____________________________________________   LABS (all labs ordered are listed, but only abnormal results are displayed)  Labs Reviewed  URINALYSIS, ROUTINE W REFLEX MICROSCOPIC   ____________________________________________  RADIOLOGY  Ct Renal Stone Study  Result Date: 01/23/2018 CLINICAL DATA:  Acute left flank pain. EXAM: CT ABDOMEN AND PELVIS WITHOUT CONTRAST TECHNIQUE: Multidetector CT imaging of the abdomen and pelvis was performed following the standard protocol without IV  contrast. COMPARISON:  CT scan of November 26, 2017. FINDINGS: Lower chest: No acute abnormality. Hepatobiliary: No focal liver abnormality is seen. No gallstones, gallbladder wall thickening, or biliary dilatation. Pancreas: Unremarkable. No pancreatic ductal dilatation or surrounding inflammatory changes. Spleen: Normal in size without focal abnormality. Adrenals/Urinary Tract: Adrenal glands appear normal. Nonobstructive left renal calculus is noted. No hydronephrosis or renal obstruction is noted. Urinary bladder is unremarkable. Stomach/Bowel: The stomach appears normal. There is no evidence of bowel obstruction or inflammation. Status post appendectomy. Vascular/Lymphatic: No significant vascular findings are present. No enlarged abdominal or pelvic lymph nodes. Reproductive: Prostate is unremarkable. Other: No abdominal wall hernia or abnormality. No abdominopelvic ascites. Musculoskeletal: No acute or significant osseous findings. IMPRESSION: Nonobstructive left renal calculus. No hydronephrosis or renal obstruction is noted. No other abnormality seen in the abdomen or pelvis. Electronically Signed   By: Lupita Raider, M.D.   On: 01/23/2018 21:45    ____________________________________________   PROCEDURES  Procedure(s) performed:   Procedures  None ____________________________________________   INITIAL IMPRESSION / ASSESSMENT AND PLAN / ED COURSE  Pertinent labs & imaging results that were available during my care of the patient were reviewed by me and considered in my medical decision making (see chart for details).  Patient presents to the emergency department for evaluation of left flank pain similar to prior kidney stones.  He is afebrile.  No tenderness to palpation of the abdomen anteriorly.  For CT renal and UA.  IV for pain medication.  Pain controlled. No obstructing renal stone. No ureteral stone. Plan for Tramadol for pain and PCP follow up. No findings on exam to suggest  acute spinal cord emergency.   At this time, I do not feel there is any life-threatening condition present. I have reviewed and discussed all results (EKG, imaging, lab, urine as appropriate), exam findings with patient. I have reviewed nursing notes and appropriate previous records.  I feel the patient is safe to be discharged home without further emergent workup. Discussed usual and customary return precautions. Patient and family (if present) verbalize understanding and are comfortable with this plan.  Patient will follow-up with their primary care provider. If they do not have a primary care provider, information for follow-up has been provided to them. All questions have been answered.  ____________________________________________  FINAL CLINICAL IMPRESSION(S) / ED DIAGNOSES  Final diagnoses:  Left flank pain     MEDICATIONS GIVEN DURING THIS VISIT:  Medications  ondansetron (ZOFRAN) injection 4 mg (4 mg Intravenous Given 01/23/18 2106)  ketorolac (TORADOL) 30 MG/ML injection 30 mg (30 mg Intravenous Given 01/23/18 2106)     NEW OUTPATIENT MEDICATIONS STARTED DURING THIS VISIT:  Discharge Medication List as of 01/23/2018  9:53 PM    START taking these medications   Details  traMADol (ULTRAM) 50 MG tablet Take 1 tablet (50 mg total) by mouth every 6 (six) hours as needed for severe pain., Starting Fri 01/23/2018, Print        Note:  This document  was prepared using Conservation officer, historic buildingsDragon voice recognition software and may include unintentional dictation errors.  Alona BeneJoshua Long, MD Emergency Medicine    Long, Arlyss RepressJoshua G, MD 01/24/18 2038

## 2018-01-23 NOTE — ED Notes (Signed)
Patient reports lower back pain that started a few days ago after picking up scrape materials. Patient states he had a kidney stone about a month ago and thought maybe he had another stone. Patient does not have CVA tenderness. Urine specimen is clear.

## 2019-01-24 NOTE — Congregational Nurse Program (Signed)
Stated he needed new glasses.told will get application from the [employer]. Will  also call [facility] for medical appointment. B  112/74 HR 104 Jenene Slicker RN, [address], [phone]

## 2022-07-05 DIAGNOSIS — N200 Calculus of kidney: Secondary | ICD-10-CM | POA: Diagnosis not present

## 2022-07-05 DIAGNOSIS — R1111 Vomiting without nausea: Secondary | ICD-10-CM | POA: Diagnosis not present

## 2022-07-05 DIAGNOSIS — J449 Chronic obstructive pulmonary disease, unspecified: Secondary | ICD-10-CM | POA: Diagnosis not present

## 2022-07-05 DIAGNOSIS — K2970 Gastritis, unspecified, without bleeding: Secondary | ICD-10-CM | POA: Diagnosis not present

## 2022-07-05 DIAGNOSIS — R109 Unspecified abdominal pain: Secondary | ICD-10-CM | POA: Diagnosis not present

## 2022-07-05 DIAGNOSIS — R69 Illness, unspecified: Secondary | ICD-10-CM | POA: Diagnosis not present

## 2022-07-05 DIAGNOSIS — R0781 Pleurodynia: Secondary | ICD-10-CM | POA: Diagnosis not present

## 2022-07-05 DIAGNOSIS — K449 Diaphragmatic hernia without obstruction or gangrene: Secondary | ICD-10-CM | POA: Diagnosis not present

## 2022-07-05 DIAGNOSIS — K2950 Unspecified chronic gastritis without bleeding: Secondary | ICD-10-CM | POA: Diagnosis not present

## 2022-07-05 DIAGNOSIS — I509 Heart failure, unspecified: Secondary | ICD-10-CM | POA: Diagnosis not present

## 2022-07-05 DIAGNOSIS — M48061 Spinal stenosis, lumbar region without neurogenic claudication: Secondary | ICD-10-CM | POA: Diagnosis not present

## 2022-08-20 DIAGNOSIS — Z760 Encounter for issue of repeat prescription: Secondary | ICD-10-CM | POA: Diagnosis not present

## 2022-08-20 DIAGNOSIS — F32A Depression, unspecified: Secondary | ICD-10-CM | POA: Diagnosis not present

## 2022-08-20 DIAGNOSIS — I509 Heart failure, unspecified: Secondary | ICD-10-CM | POA: Diagnosis not present

## 2022-08-20 DIAGNOSIS — J449 Chronic obstructive pulmonary disease, unspecified: Secondary | ICD-10-CM | POA: Diagnosis not present

## 2022-08-20 DIAGNOSIS — R69 Illness, unspecified: Secondary | ICD-10-CM | POA: Diagnosis not present

## 2022-08-20 DIAGNOSIS — Z886 Allergy status to analgesic agent status: Secondary | ICD-10-CM | POA: Diagnosis not present

## 2022-09-16 DIAGNOSIS — R69 Illness, unspecified: Secondary | ICD-10-CM | POA: Diagnosis not present

## 2022-09-16 DIAGNOSIS — Z20822 Contact with and (suspected) exposure to covid-19: Secondary | ICD-10-CM | POA: Diagnosis not present

## 2022-09-17 DIAGNOSIS — M792 Neuralgia and neuritis, unspecified: Secondary | ICD-10-CM | POA: Diagnosis not present

## 2022-09-17 DIAGNOSIS — F322 Major depressive disorder, single episode, severe without psychotic features: Secondary | ICD-10-CM | POA: Diagnosis not present

## 2022-09-17 DIAGNOSIS — I509 Heart failure, unspecified: Secondary | ICD-10-CM | POA: Diagnosis not present

## 2022-09-17 DIAGNOSIS — M1990 Unspecified osteoarthritis, unspecified site: Secondary | ICD-10-CM | POA: Diagnosis not present

## 2022-09-17 DIAGNOSIS — Z91148 Patient's other noncompliance with medication regimen for other reason: Secondary | ICD-10-CM | POA: Diagnosis not present

## 2022-09-17 DIAGNOSIS — J449 Chronic obstructive pulmonary disease, unspecified: Secondary | ICD-10-CM | POA: Diagnosis not present

## 2022-09-17 DIAGNOSIS — K219 Gastro-esophageal reflux disease without esophagitis: Secondary | ICD-10-CM | POA: Diagnosis not present

## 2022-09-17 DIAGNOSIS — R45851 Suicidal ideations: Secondary | ICD-10-CM | POA: Diagnosis not present

## 2022-09-17 DIAGNOSIS — Z9151 Personal history of suicidal behavior: Secondary | ICD-10-CM | POA: Diagnosis not present

## 2022-09-17 DIAGNOSIS — R69 Illness, unspecified: Secondary | ICD-10-CM | POA: Diagnosis not present

## 2022-09-27 DIAGNOSIS — R69 Illness, unspecified: Secondary | ICD-10-CM | POA: Diagnosis not present

## 2024-03-28 ENCOUNTER — Emergency Department (HOSPITAL_COMMUNITY): Payer: Self-pay

## 2024-03-28 ENCOUNTER — Other Ambulatory Visit: Payer: Self-pay

## 2024-03-28 ENCOUNTER — Encounter (HOSPITAL_COMMUNITY): Payer: Self-pay

## 2024-03-28 ENCOUNTER — Emergency Department (HOSPITAL_COMMUNITY)
Admission: EM | Admit: 2024-03-28 | Discharge: 2024-03-28 | Disposition: A | Discharge status: Home / Self Care | Payer: Self-pay | Attending: Emergency Medicine | Admitting: Emergency Medicine

## 2024-03-28 DIAGNOSIS — I509 Heart failure, unspecified: Secondary | ICD-10-CM | POA: Insufficient documentation

## 2024-03-28 DIAGNOSIS — R0602 Shortness of breath: Secondary | ICD-10-CM

## 2024-03-28 DIAGNOSIS — D72829 Elevated white blood cell count, unspecified: Secondary | ICD-10-CM | POA: Insufficient documentation

## 2024-03-28 DIAGNOSIS — Z7951 Long term (current) use of inhaled steroids: Secondary | ICD-10-CM | POA: Insufficient documentation

## 2024-03-28 DIAGNOSIS — J441 Chronic obstructive pulmonary disease with (acute) exacerbation: Secondary | ICD-10-CM | POA: Insufficient documentation

## 2024-03-28 LAB — COMPREHENSIVE METABOLIC PANEL WITH GFR
ALT: 17 U/L (ref 0–44)
AST: 23 U/L (ref 15–41)
Albumin: 4.2 g/dL (ref 3.5–5.0)
Alkaline Phosphatase: 82 U/L (ref 38–126)
Anion gap: 13 (ref 5–15)
BUN: 20 mg/dL (ref 8–23)
CO2: 23 mmol/L (ref 22–32)
Calcium: 9.6 mg/dL (ref 8.9–10.3)
Chloride: 101 mmol/L (ref 98–111)
Creatinine, Ser: 1.11 mg/dL (ref 0.61–1.24)
GFR, Estimated: >60 mL/min (ref >60)
Glucose, Bld: 77 mg/dL (ref 70–99)
Potassium: 3.4 mmol/L — ABNORMAL LOW (ref 3.5–5.1)
Sodium: 137 mmol/L (ref 135–145)
Total Bilirubin: 0.8 mg/dL (ref 0.0–1.2)
Total Protein: 7.8 g/dL (ref 6.5–8.1)

## 2024-03-28 LAB — CBC
HCT: 45.7 % (ref 39.0–52.0)
Hemoglobin: 15.4 g/dL (ref 13.0–17.0)
MCH: 31.6 pg (ref 26.0–34.0)
MCHC: 33.7 g/dL (ref 30.0–36.0)
MCV: 93.8 fL (ref 80.0–100.0)
Platelets: 251 10*3/uL (ref 150–400)
RBC: 4.87 MIL/uL (ref 4.22–5.81)
RDW: 13.4 % (ref 11.5–15.5)
WBC: 10.8 10*3/uL — ABNORMAL HIGH (ref 4.0–10.5)
nRBC: 0 % (ref 0.0–0.2)

## 2024-03-28 LAB — RESP PANEL BY RT-PCR (RSV, FLU A&B, COVID)  RVPGX2
Influenza A by PCR: NEGATIVE
Influenza B by PCR: NEGATIVE
Resp Syncytial Virus by PCR: NEGATIVE
SARS Coronavirus 2 by RT PCR: NEGATIVE

## 2024-03-28 LAB — BRAIN NATRIURETIC PEPTIDE: B Natriuretic Peptide: 52 pg/mL (ref 0.0–100.0)

## 2024-03-28 LAB — TROPONIN I (HIGH SENSITIVITY): Troponin I (High Sensitivity): 18 ng/L — ABNORMAL HIGH (ref <18)

## 2024-03-28 MED ORDER — ACETAMINOPHEN 325 MG PO TABS
650.0000 mg | ORAL_TABLET | Freq: Once | ORAL | Status: AC
  Administered 2024-03-28: 650 mg via ORAL
  Filled 2024-03-28: qty 2

## 2024-03-28 MED ORDER — IPRATROPIUM-ALBUTEROL 0.5-2.5 (3) MG/3ML IN SOLN
3.0000 mL | Freq: Once | RESPIRATORY_TRACT | Status: AC
  Administered 2024-03-28: 3 mL via RESPIRATORY_TRACT
  Filled 2024-03-28: qty 3

## 2024-03-28 MED ORDER — PREDNISONE 20 MG PO TABS: 40.0000 mg | ORAL_TABLET | Freq: Every day | ORAL | 0 refills | Status: AC

## 2024-03-28 MED ORDER — HYDROXYZINE HCL 25 MG PO TABS
25.0000 mg | ORAL_TABLET | Freq: Once | ORAL | Status: AC
  Administered 2024-03-28: 25 mg via ORAL
  Filled 2024-03-28: qty 1
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
## 2024-03-28 NOTE — Discharge Instructions (Signed)
 As discussed, your workup again was reassuring.  Will send you home with prednisone  given concern for possible COPD flare up.  Continue use your breathing treatments at home.  Recommend follow-up with your primary care for reassessment.  Please do not hesitate to return if the worrisome signs and symptoms we discussed become apparent.
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
## 2024-03-28 NOTE — ED Notes (Signed)
 Patient seen at [facility] x 2 last night/today, once via EMS around 0400. Patient states he was given Morphine with no relief, and breathing treatment with some relief.
